# Patient Record
Sex: Female | Born: 1975 | Race: Asian | Hispanic: No | Marital: Married | Smoking: Never smoker
Health system: Southern US, Community
[De-identification: ages and names within clinical notes are randomized; demographics above are authoritative.]

## PROBLEM LIST (undated history)

## (undated) HISTORY — PX: KNEE SURGERY: SHX244

## (undated) HISTORY — PX: EYE SURGERY: SHX253

---

## 2010-09-03 ENCOUNTER — Ambulatory Visit (HOSPITAL_COMMUNITY)
Admission: RE | Admit: 2010-09-03 | Discharge: 2010-09-03 | Disposition: A | Discharge status: Home / Self Care | Payer: 59 | Source: Ambulatory Visit | Attending: Obstetrics and Gynecology | Admitting: Obstetrics and Gynecology

## 2010-09-03 ENCOUNTER — Encounter (HOSPITAL_COMMUNITY): Payer: Self-pay

## 2010-09-03 ENCOUNTER — Ambulatory Visit (HOSPITAL_COMMUNITY)
Admission: RE | Admit: 2010-09-03 | Discharge: 2010-09-03 | Disposition: A | Discharge status: Home / Self Care | Payer: 59 | Source: Ambulatory Visit

## 2010-09-03 ENCOUNTER — Other Ambulatory Visit (HOSPITAL_COMMUNITY): Payer: Self-pay | Admitting: Obstetrics and Gynecology

## 2010-09-03 DIAGNOSIS — Z0489 Encounter for examination and observation for other specified reasons: Secondary | ICD-10-CM

## 2010-09-03 DIAGNOSIS — O2690 Pregnancy related conditions, unspecified, unspecified trimester: Secondary | ICD-10-CM

## 2010-09-03 DIAGNOSIS — O3510X0 Maternal care for (suspected) chromosomal abnormality in fetus, unspecified, not applicable or unspecified: Secondary | ICD-10-CM | POA: Insufficient documentation

## 2010-09-03 DIAGNOSIS — IMO0002 Reserved for concepts with insufficient information to code with codable children: Secondary | ICD-10-CM

## 2010-09-03 DIAGNOSIS — O351XX0 Maternal care for (suspected) chromosomal abnormality in fetus, not applicable or unspecified: Secondary | ICD-10-CM | POA: Insufficient documentation

## 2010-09-03 DIAGNOSIS — Z3682 Encounter for antenatal screening for nuchal translucency: Secondary | ICD-10-CM

## 2010-09-03 DIAGNOSIS — Z3689 Encounter for other specified antenatal screening: Secondary | ICD-10-CM | POA: Insufficient documentation

## 2010-09-03 NOTE — Progress Notes (Signed)
Genetic Counseling  High-Risk Gestation Note  Appointment Date:  09/03/2010 Referred By: Hyman Hopes., MD Date of Birth:  1975-05-25 Partner:  Lars Mage    Pregnancy History: [id number] Estimated Date of Delivery: 03/06/11 Estimated Gestational Age: [redacted]w[redacted]d  I met with Janet Dalton for prenatal genetic counseling given an increased nuchal translucency measurement on first trimester ultrasound. The patient was accompanied by her husband's aunt at the time of today's visit.   We reviewed the results of Janet Dalton's ultrasound today, which revealed a nuchal translucency measurement of 3.0 mm, which is considered to be slightly greater than the normal range (95%tile). We discussed that this increased NT measurement slightly increases the risk for an underlying chromosome condition, birth defect such as a congenital heart defect, or genetic syndrome which may not be able to be detected prenatally. We also discussed that the increased NT can be a normal variation.   We reviewed chromosomes, nondisjunction, and examples of chromosome conditions. We specifically discussed Down syndrome (trisomy 21) and trisomy 52, including the prognoses and common features of each.  We reviewed available screening and diagnostic options for chromosome conditions.  Regarding screening tests, we discussed the options of First screen and ultrasound.  They understand that screening tests are used to modify a patient's a priori risk for aneuploidy, typically based on age.  First screen provides a risk assessment Down syndrome and trisomy 18 by combining the ultrasound NT measurement and biochemistry assessment of maternal blood. This estimate provides a pregnancy specific risk assessment and is not diagnostic for these conditions.  We also reviewed the availability of diagnostic options including CVS and amniocentesis. A risk of 1 in 100 was given for CVS and 1 in 200-300 was given for amniocentesis, the primary  complication being spontaneous pregnancy loss.  It was discussed that not all birth defects can be identified with these procedures.  We discussed the risks, limitations, and benefits of each.    After reviewing these options, Janet Dalton elected to have First trimester screening at the time of today's visit and will return on 10/11/10 for a detailed anatomy scan.  She wishes to pursue these options to help ascertain her pregnancy specific risks for aneuploidy and will consider amniocentesis pending the results of this testing.  She understands that ultrasound and First screen cannot rule out all birth defects or genetic syndromes.  Given the association of increased NT measurement with congenital heart defects, the option of a fetal echocardiogram in the second trimester was also discussed.  Both family histories were reviewed and found to be contributory for developmental delays. Janet Dalton sister reported had a child with her maternal first cousin once removed. This child is not 29 years old and cannot walk or talk. The patient had limited information regarding whether or not an underlying diagnosis had been determined. We discussed that without an underlying cause, we are unable to accurately assess recurrence risk or offer testing to relatives. We reviewed that consanguineous unions increase the risk for underlying autosomal recessive and multifactorial conditions in offspring. A genetics evaluation for this relative, if not previously performed would be helpful to assess for an underlying cause in order to more accurately obtain recurrence risk estimate information.  Without further information regarding the provided family history, an accurate genetic risk cannot be calculated.   Further genetic counseling is warranted if more information is obtained.  Please see scanned pedigree for complete family history information.    She denied exposure to environmental toxins  or chemical agents.  She denied  the use of alcohol, tobacco or street drugs.  She denied significant viral illnesses during the course of her pregnancy.  Her medical and surgical history were noncontributory.   A complete obstetrical ultrasound was performed at the time of today's evaluation.  The ultrasound report is reported separately.    We counseled the patient for approximately 30 minutes regarding the above risks.     Janet Braun Adair Lemar, MS, [facility] 09/03/2010

## 2010-09-10 ENCOUNTER — Telehealth (HOSPITAL_COMMUNITY): Payer: Self-pay | Admitting: MS"

## 2010-09-10 NOTE — Telephone Encounter (Signed)
John Strider called to ask if First Screen results were back. As previously discussed, patient preferred for results to be discussed with Jonny Ruiz over the phone given that English is her second language. First Screen results are screen negative for Down syndrome (1:3500) and Trisomy 18 (1:10,000). Reviewed that the screen is not diagnostic and patient still has option of amniocentesis, if desired. Targeted ultrasound scheduled for 10/11/10. Fetal echocardiogram also still available to the patient. John stated that he would discuss with Janet Dalton. She may still want to pursue amniocentesis. Encouraged John or patient to contact us with additional questions.

## 2010-10-11 ENCOUNTER — Ambulatory Visit (HOSPITAL_COMMUNITY)
Admission: RE | Admit: 2010-10-11 | Discharge: 2010-10-11 | Disposition: A | Discharge status: Home / Self Care | Payer: 59 | Source: Ambulatory Visit | Attending: Obstetrics and Gynecology | Admitting: Obstetrics and Gynecology

## 2010-10-11 DIAGNOSIS — IMO0002 Reserved for concepts with insufficient information to code with codable children: Secondary | ICD-10-CM

## 2010-10-11 DIAGNOSIS — Z1389 Encounter for screening for other disorder: Secondary | ICD-10-CM | POA: Insufficient documentation

## 2010-10-11 DIAGNOSIS — Z363 Encounter for antenatal screening for malformations: Secondary | ICD-10-CM | POA: Insufficient documentation

## 2010-10-11 DIAGNOSIS — O358XX0 Maternal care for other (suspected) fetal abnormality and damage, not applicable or unspecified: Secondary | ICD-10-CM | POA: Insufficient documentation

## 2010-10-11 DIAGNOSIS — Z0489 Encounter for examination and observation for other specified reasons: Secondary | ICD-10-CM

## 2010-10-18 ENCOUNTER — Other Ambulatory Visit: Payer: Self-pay | Admitting: Maternal and Fetal Medicine

## 2010-10-26 ENCOUNTER — Telehealth (HOSPITAL_COMMUNITY): Payer: Self-pay | Admitting: MS"

## 2010-10-26 NOTE — Telephone Encounter (Signed)
Results of [facility] (cell free fetal DNA testing) to Jonny Ruiz, patient's husband, per patient's request. Less than 1 in 10,000 risk for trisomies 21, 18, or 13. Reviewed that this screen is not diagnostic. John stated that he is happy with these results.

## 2012-11-08 ENCOUNTER — Other Ambulatory Visit: Payer: Self-pay

## 2012-12-05 ENCOUNTER — Other Ambulatory Visit (HOSPITAL_COMMUNITY): Payer: Self-pay | Admitting: Obstetrics and Gynecology

## 2012-12-05 DIAGNOSIS — Z3682 Encounter for antenatal screening for nuchal translucency: Secondary | ICD-10-CM

## 2012-12-13 ENCOUNTER — Ambulatory Visit (HOSPITAL_COMMUNITY)
Admission: RE | Admit: 2012-12-13 | Discharge: 2012-12-13 | Disposition: A | Discharge status: Home / Self Care | Payer: 59 | Source: Ambulatory Visit | Attending: Obstetrics and Gynecology | Admitting: Obstetrics and Gynecology

## 2012-12-13 ENCOUNTER — Encounter (HOSPITAL_COMMUNITY): Payer: Self-pay

## 2012-12-13 ENCOUNTER — Other Ambulatory Visit: Payer: Self-pay

## 2012-12-13 DIAGNOSIS — O351XX0 Maternal care for (suspected) chromosomal abnormality in fetus, not applicable or unspecified: Secondary | ICD-10-CM | POA: Insufficient documentation

## 2012-12-13 DIAGNOSIS — O3510X0 Maternal care for (suspected) chromosomal abnormality in fetus, unspecified, not applicable or unspecified: Secondary | ICD-10-CM | POA: Insufficient documentation

## 2012-12-13 DIAGNOSIS — Z3682 Encounter for antenatal screening for nuchal translucency: Secondary | ICD-10-CM

## 2012-12-13 DIAGNOSIS — O0990 Supervision of high risk pregnancy, unspecified, unspecified trimester: Secondary | ICD-10-CM | POA: Insufficient documentation

## 2012-12-13 DIAGNOSIS — Z3689 Encounter for other specified antenatal screening: Secondary | ICD-10-CM | POA: Insufficient documentation

## 2012-12-13 DIAGNOSIS — O09529 Supervision of elderly multigravida, unspecified trimester: Secondary | ICD-10-CM | POA: Insufficient documentation

## 2012-12-14 NOTE — Progress Notes (Signed)
Genetic Counseling  High-Risk Gestation Note  Appointment Date:  12/13/2012 Referred By: Marylen Ponto, DO Date of Birth:  04-16-1975   Pregnancy History: Janet Dalton Estimated Date of Delivery: 06/23/13 Estimated Gestational Age: [redacted]w[redacted]d Attending: Particia Nearing, MD   Janet Dalton was seen for genetic counseling because of a maternal age of 37 y.o..     She was counseled regarding maternal age and the association with risk for chromosome conditions due to nondisjunction with aging of the ova.   We reviewed chromosomes, nondisjunction, and the associated 1 in 114 risk for fetal aneuploidy related to a maternal age of 37 y.o. at [redacted]w[redacted]d gestation.  She was counseled that the risk for aneuploidy decreases as gestational age increases, accounting for those pregnancies which spontaneously abort.  We specifically discussed Down syndrome (trisomy 64), trisomies 65 and 1, and sex chromosome aneuploidies (47,XXX and 47,XXY) including the common features and prognoses of each.   We reviewed available screening options including First Screen, Quad screen, noninvasive prenatal screening (NIPS)/cell free fetal DNA (cffDNA) testing, and detailed ultrasound.  She was counseled that screening tests are used to modify a patient's a priori risk for aneuploidy, typically based on age. This estimate provides a pregnancy specific risk assessment. We reviewed the benefits and limitations of each option. Specifically, we discussed the conditions for which each test screens, the detection rates, and false positive rates of each. She was also counseled regarding diagnostic testing via CVS and amniocentesis. We reviewed the approximate 1 in 300-500 risk for complications for amniocentesis, including spontaneous pregnancy loss. After consideration of all the options, she elected to proceed with NIPS ([facility]) at the time of today's visit.  Those results will be available in approximately 8-10 days.  She stated that she would  possibly consider amniocentesis pending results of NIPS. She declined CVS and amniocentesis at the time of today's visit.   She also expressed interest in pursuing a nuchal translucency ultrasound, which was performed today.  The report will be documented separately.  The patient would like to return for a detailed ultrasound at ~18+ weeks gestation.  This appointment was scheduled for 01/24/13. She understands that screening tests cannot rule out all birth defects or genetic syndromes. The patient was advised of this limitation and states she still does not want additional testing at this time.   Janet Dalton was provided with written information regarding cystic fibrosis (CF) including the carrier frequency and incidence in the Caucasian and Asian populations, the availability of carrier testing and prenatal diagnosis if indicated.  In addition, we discussed that CF is routinely screened for as part of the [place] newborn screening panel.  She declined testing today.   Both family histories were reviewed and updated from the patient's visit from 09/03/2010. The updated family histories were noncontributory for updates regarding birth defects, intellectual disability, and known genetic conditions. See previous genetic counseling note from 09/03/10 for detailed family history discussion. Without further information regarding the provided family history, an accurate genetic risk cannot be calculated. Further genetic counseling is warranted if more information is obtained.  Additionally, the father of the pregnancy is reportedly 71 years old. Advanced paternal age (APA) is defined as paternal age greater than or equal to age 53.  Recent large-scale sequencing studies have shown that approximately 80% of de novo point mutations are of paternal origin.  Many studies have demonstrated a strong correlation between increased paternal age and de novo point mutations.  Although no specific data is available regarding  fetal risks for fathers 71+ years old at conception, it is apparent that the overall risk for single gene conditions is increased.  To estimate the relative increase in risk of a genetic disorder with APA, the heritability of the disease must be considered.  Assuming an approximate 2x increase in risk for conditions that are exclusively paternal in origin, the risk for each individual condition is still relatively low.  It is estimated that the overall chance for a de novo mutation is ~0.5%.  We also discussed the wide range of conditions which can be caused by new dominant gene mutations (achondroplasia, neurofibromatosis, Marfan syndrome etc.).  She was counseled that genetic testing for each individual single gene condition is not warranted or available unless ultrasound or family history concerns lend suspicion to a specific condition.  In addition, we discussed the recommendation for a detailed ultrasound at 18+ weeks gestation and a follow up ultrasound at ~28 weeks to monitor fetal growth.  Janet Dalton denied exposure to environmental toxins or chemical agents. She reported having a knee x ray on 10/13/12. The all-or-none period was discussed, meaning exposures that occur in the first 4 weeks of gestation are typically thought to either not affect the pregnancy at all or result in a miscarriage. The dose of ionizing radiation is an important factor in determining the potential toxicity to a pregnancy. Available study data suggest x ray exposure at 5 rad or less is not associated with an increased risk for birth defects. We discussed that the reported timing and type of x ray would not be expected to increase the risk for adverse pregnancy outcomes. She denied the use of alcohol, tobacco or street drugs. She denied significant viral illnesses during the course of her pregnancy. Her medical and surgical histories were noncontributory.   I counseled Janet Dalton regarding the above risks and  available options.  The approximate face-to-face time with the genetic counselor was 40 minutes.  Janet Plowman, MS,  Patent attorney

## 2012-12-27 ENCOUNTER — Telehealth (HOSPITAL_COMMUNITY): Payer: Self-pay | Admitting: MS"

## 2012-12-27 NOTE — Telephone Encounter (Signed)
Called Janet Dalton to discuss her cell free fetal DNA test results.  Mrs. Janet Dalton had Panorama testing through [facility].  Testing was offered because of advanced maternal age.   The patient was identified by name and DOB.  We reviewed that these are within normal limits, showing a less than 1 in 10,000 risk for trisomies 21, 18 and 13, and 1 in 1,000 risk for monosomy X (Turner syndrome).  In addition, the risk for triploidy/vanishing twin and sex chromosome trisomies (47,XXX and 47,XXY) was also low risk.  We reviewed that this testing identifies > 99% of pregnancies with trisomy 24, trisomy 65, sex chromosome trisomies (47,XXX and 47,XXY), and triploidy. The detection rate for trisomy 18 is 96%.  The detection rate for monosomy X is ~92%.  The false positive rate is <0.1% for all conditions. Testing was also consistent with female gender.  The patient did wish to know gender.  She understands that this testing does not identify all genetic conditions.  All questions were answered to her satisfaction, she was encouraged to call with additional questions or concerns.  Quinn Plowman, MS Certified Genetic Counselor 12/27/2012 10:00 AM

## 2013-01-23 ENCOUNTER — Other Ambulatory Visit (HOSPITAL_COMMUNITY): Payer: Self-pay | Admitting: Obstetrics and Gynecology

## 2013-01-23 DIAGNOSIS — O09529 Supervision of elderly multigravida, unspecified trimester: Secondary | ICD-10-CM

## 2013-01-24 ENCOUNTER — Other Ambulatory Visit (HOSPITAL_COMMUNITY): Payer: 59

## 2013-01-24 ENCOUNTER — Ambulatory Visit (HOSPITAL_COMMUNITY)
Admission: RE | Admit: 2013-01-24 | Discharge: 2013-01-24 | Disposition: A | Discharge status: Home / Self Care | Payer: 59 | Source: Ambulatory Visit | Attending: Obstetrics and Gynecology | Admitting: Obstetrics and Gynecology

## 2013-01-24 DIAGNOSIS — O358XX0 Maternal care for other (suspected) fetal abnormality and damage, not applicable or unspecified: Secondary | ICD-10-CM | POA: Insufficient documentation

## 2013-01-24 DIAGNOSIS — Z363 Encounter for antenatal screening for malformations: Secondary | ICD-10-CM | POA: Insufficient documentation

## 2013-01-24 DIAGNOSIS — O09529 Supervision of elderly multigravida, unspecified trimester: Secondary | ICD-10-CM | POA: Insufficient documentation

## 2013-01-24 DIAGNOSIS — Z1389 Encounter for screening for other disorder: Secondary | ICD-10-CM | POA: Insufficient documentation

## 2013-01-30 ENCOUNTER — Other Ambulatory Visit (HOSPITAL_COMMUNITY): Payer: 59

## 2013-10-18 ENCOUNTER — Encounter (HOSPITAL_COMMUNITY): Payer: Self-pay | Admitting: *Deleted

## 2013-12-16 ENCOUNTER — Encounter (HOSPITAL_COMMUNITY): Payer: Self-pay | Admitting: *Deleted

## 2015-09-20 IMAGING — US US OB NUCHAL TRANSLUCENCY 1ST GEST
1 series · 13 of 28 positions shown · non-contrast
Comparison: none

[Series 1: us ob nuchal translucency 1st gest · 0.18mm/px · 13 of 34 slices shown]
[im 2/34]
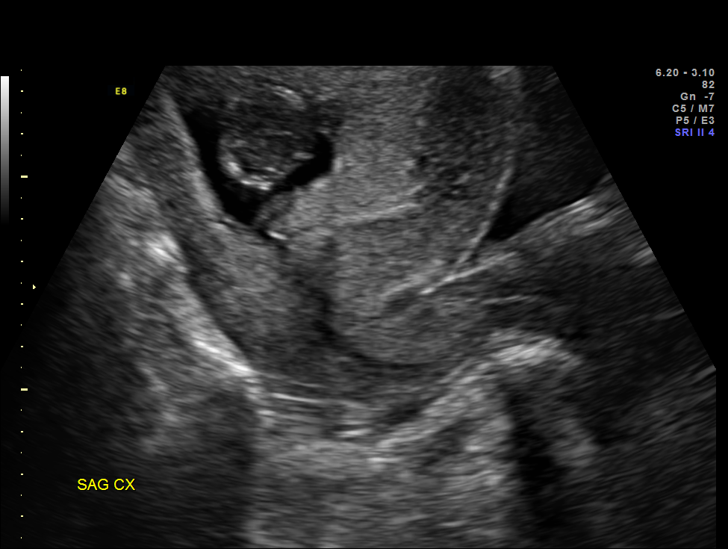
[im 4/34]
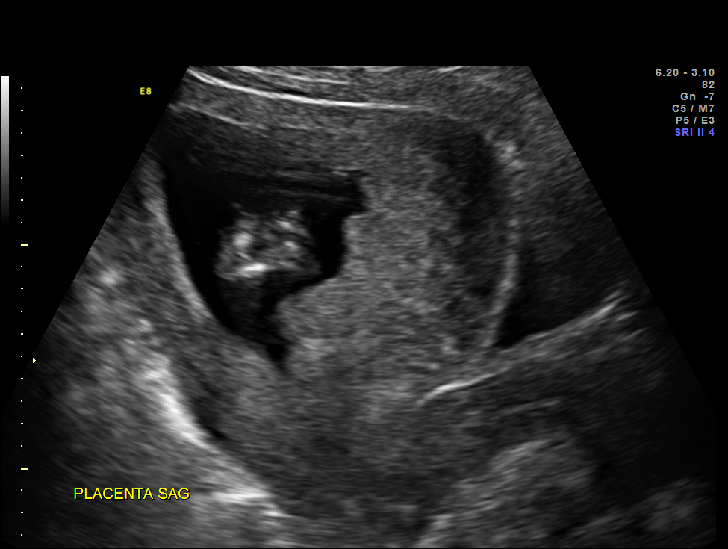
[im 7/34]
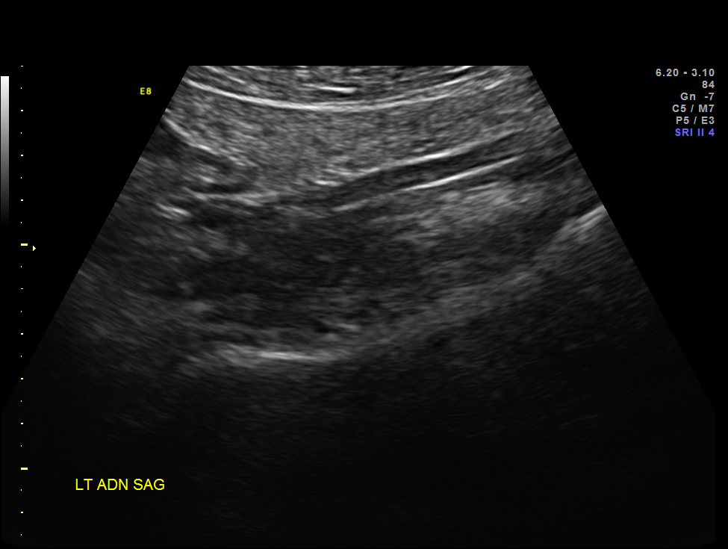
[im 9/34]
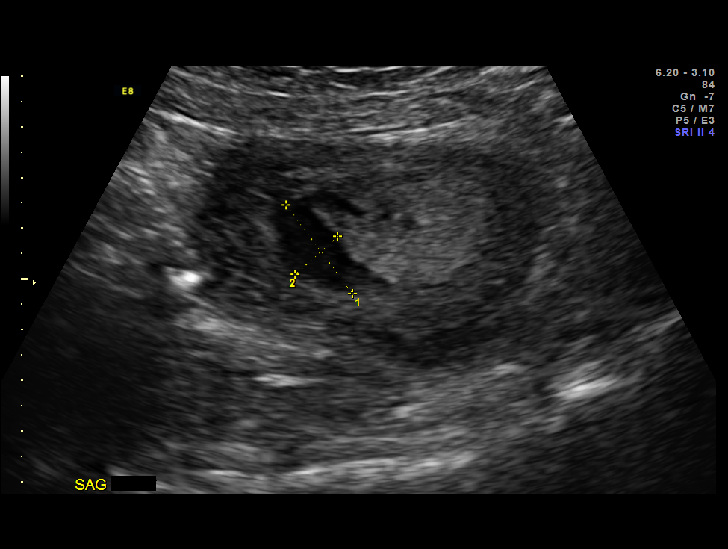
[im 12/34]
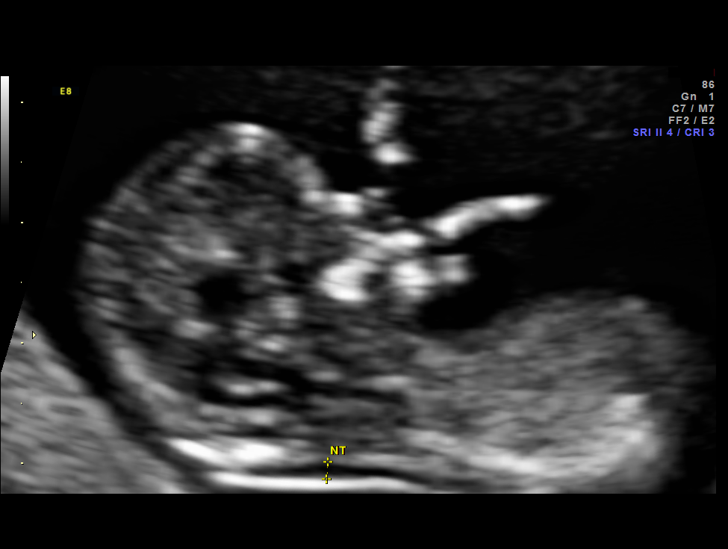
[im 14/34]
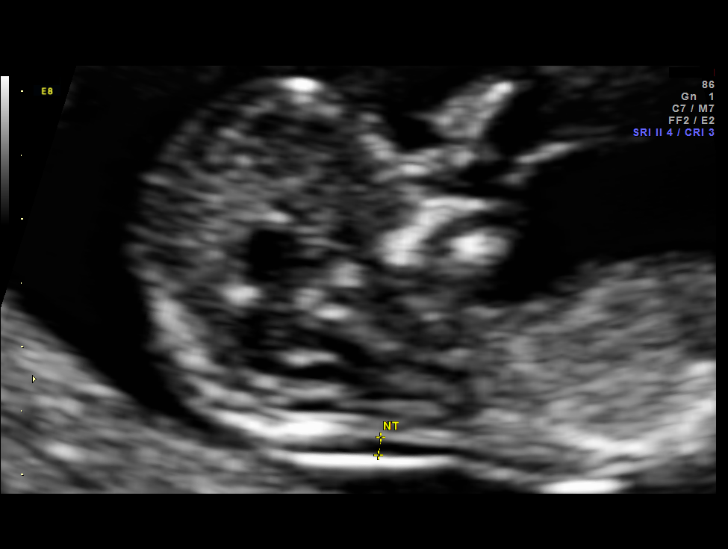
[im 18/34]
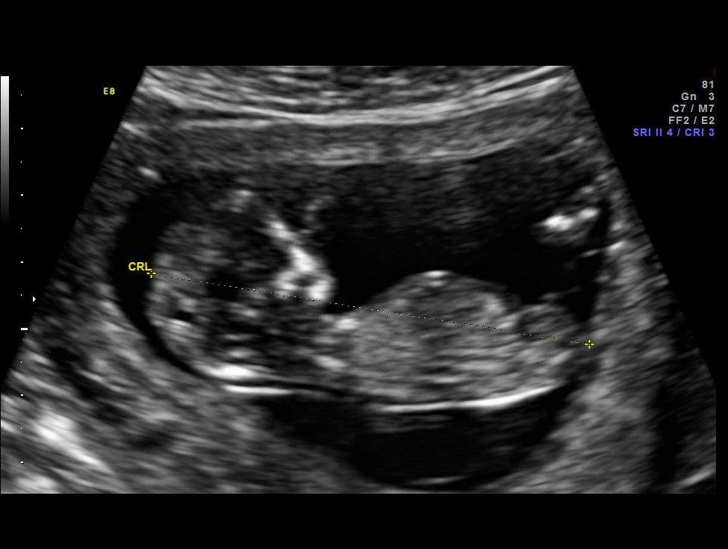
[im 20/34]
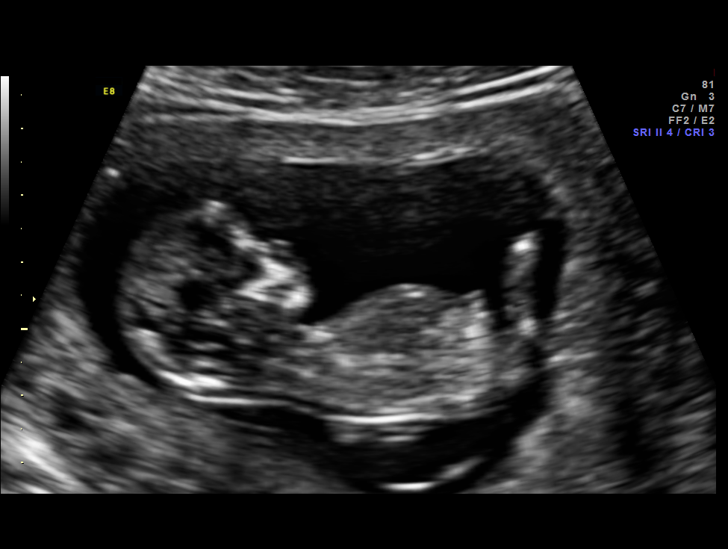
[im 23/34]
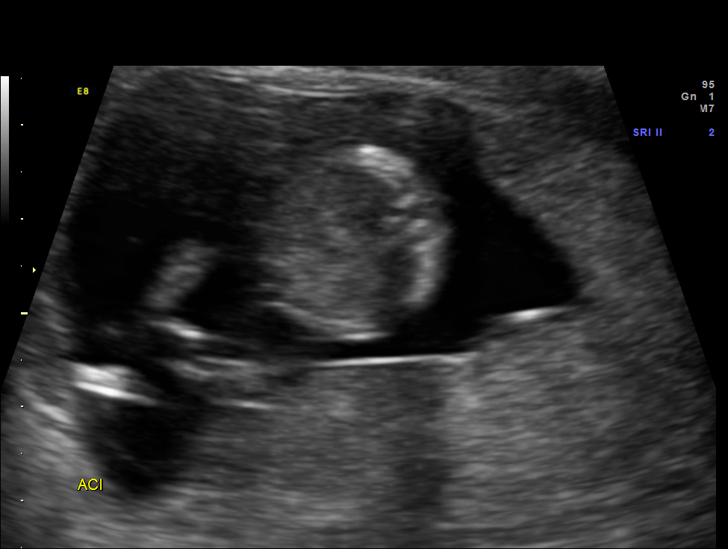
[im 25/34]
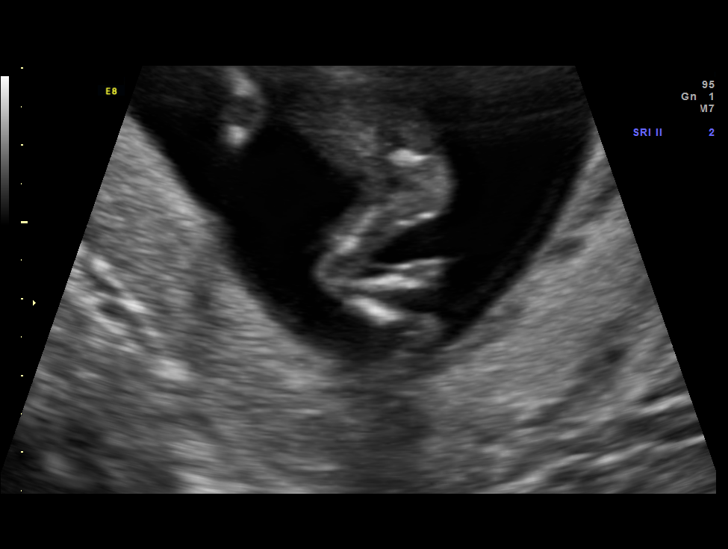
[im 27/34]
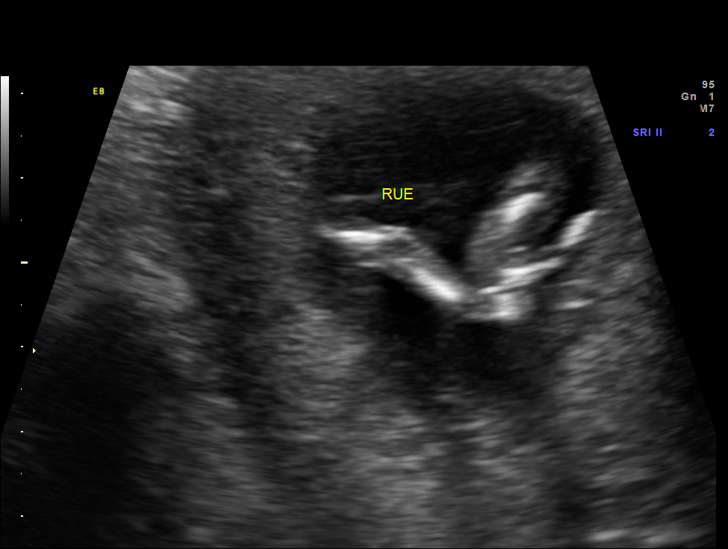
[im 30/34]
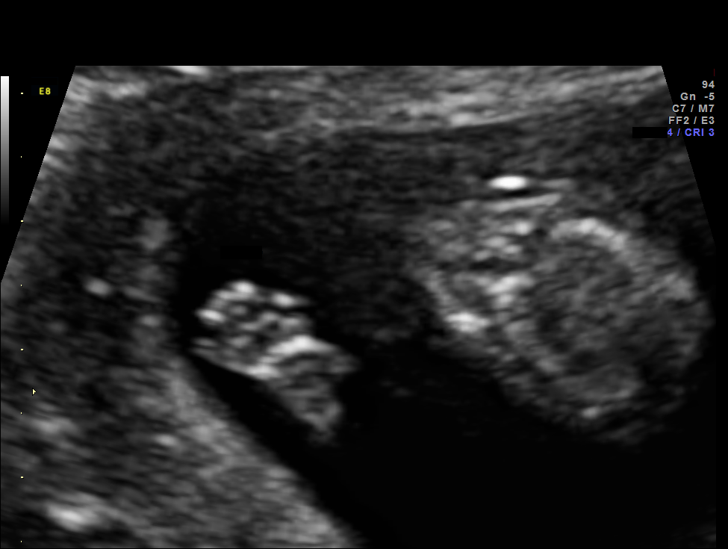
[im 32/34]
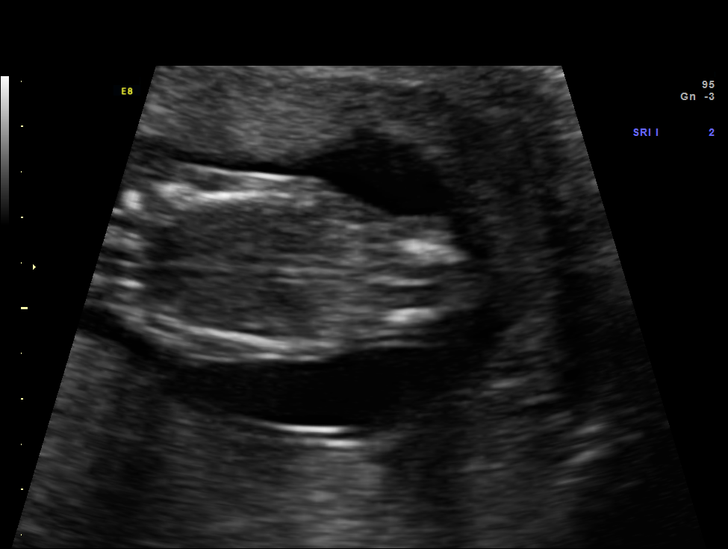

[13 of 28 positions shown; findings below may reference images not displayed]

OBSTETRICS REPORT
                      (Signed Final 12/13/2012 [DATE])

Service(s) Provided

 US FETAL NUCHAL TRANSLUCENCY                          76813.0
 MEASUREMENT
Indications

 First trimester aneuploidy screen (NT)
 Advanced maternal age (35yo), Multigravida
 Advanced paternal age (HARTUNG)
Fetal Evaluation

 Num Of Fetuses:    1
 Fetal Heart Rate:  163                          bpm
 Cardiac Activity:  Observed
 Presentation:      Transverse, head to
                    maternal right
 Placenta:          Anterior

 Amniotic Fluid
 AFI FV:      Subjectively within normal limits
Gestational Age

 Best:          12w 4d     Det. By:  Early Ultrasound         EDD:   06/23/13
                                     (11/12/12)
1st Trimester Genetic Sonogram Screening

 CRL:            66.3  mm    G. Age:   12w 5d                 EDD:   06/22/13
 Nuc Trans:       1.6  mm

 Nasal Bone:                 Present
Cervix Uterus Adnexa

 Cervix:       Normal appearance by transabdominal scan.

 Adnexa:     No abnormality visualized.
Impression

 IUP at 12+4 weeks
 No gross abnormalities identified
 NT measurement was within normal limits for this GA; NB
 present
 Normal amniotic fluid volume
 Measurements consistent with prior US

 After genetic counseling, Ms. Delvecchio decided to have cell free
 DNA (Panorama) screening. We will forward the results to
 you as soon as they are available.
Recommendations

 Follow-up ultrasound in 6 weeks for detailed anatomic survey

 questions or concerns.

## 2016-04-22 DIAGNOSIS — N925 Other specified irregular menstruation: Secondary | ICD-10-CM | POA: Diagnosis not present

## 2016-04-22 DIAGNOSIS — Z6821 Body mass index (BMI) 21.0-21.9, adult: Secondary | ICD-10-CM | POA: Diagnosis not present

## 2016-04-22 DIAGNOSIS — Z01419 Encounter for gynecological examination (general) (routine) without abnormal findings: Secondary | ICD-10-CM | POA: Diagnosis not present

## 2016-04-22 DIAGNOSIS — N926 Irregular menstruation, unspecified: Secondary | ICD-10-CM | POA: Diagnosis not present

## 2016-04-22 DIAGNOSIS — Z01411 Encounter for gynecological examination (general) (routine) with abnormal findings: Secondary | ICD-10-CM | POA: Diagnosis not present

## 2016-05-02 DIAGNOSIS — N925 Other specified irregular menstruation: Secondary | ICD-10-CM | POA: Diagnosis not present

## 2016-05-27 DIAGNOSIS — R87615 Unsatisfactory cytologic smear of cervix: Secondary | ICD-10-CM | POA: Diagnosis not present

## 2016-05-27 DIAGNOSIS — N926 Irregular menstruation, unspecified: Secondary | ICD-10-CM | POA: Diagnosis not present

## 2016-05-27 DIAGNOSIS — Z01419 Encounter for gynecological examination (general) (routine) without abnormal findings: Secondary | ICD-10-CM | POA: Diagnosis not present

## 2016-08-23 DIAGNOSIS — B355 Tinea imbricata: Secondary | ICD-10-CM | POA: Diagnosis not present

## 2016-10-04 DIAGNOSIS — R0789 Other chest pain: Secondary | ICD-10-CM | POA: Diagnosis not present

## 2017-04-28 DIAGNOSIS — Z0000 Encounter for general adult medical examination without abnormal findings: Secondary | ICD-10-CM | POA: Diagnosis not present

## 2017-04-28 DIAGNOSIS — Z6821 Body mass index (BMI) 21.0-21.9, adult: Secondary | ICD-10-CM | POA: Diagnosis not present

## 2017-04-28 DIAGNOSIS — Z01419 Encounter for gynecological examination (general) (routine) without abnormal findings: Secondary | ICD-10-CM | POA: Diagnosis not present

## 2019-01-18 ENCOUNTER — Other Ambulatory Visit: Payer: Self-pay

## 2019-01-18 DIAGNOSIS — Z20822 Contact with and (suspected) exposure to covid-19: Secondary | ICD-10-CM

## 2019-01-19 LAB — NOVEL CORONAVIRUS, NAA: SARS-CoV-2, NAA: NOT DETECTED

## 2020-11-29 ENCOUNTER — Encounter (HOSPITAL_COMMUNITY): Payer: Self-pay

## 2020-11-29 ENCOUNTER — Inpatient Hospital Stay (HOSPITAL_COMMUNITY): Payer: 59

## 2020-11-29 ENCOUNTER — Emergency Department (HOSPITAL_COMMUNITY): Payer: 59

## 2020-11-29 ENCOUNTER — Other Ambulatory Visit: Payer: Self-pay

## 2020-11-29 ENCOUNTER — Inpatient Hospital Stay (HOSPITAL_COMMUNITY)
Admission: EM | Admit: 2020-11-29 | Discharge: 2020-12-01 | DRG: 489 | Disposition: A | Discharge status: Home / Self Care | Payer: 59 | Attending: Orthopaedic Surgery | Admitting: Orthopaedic Surgery

## 2020-11-29 DIAGNOSIS — W010XXA Fall on same level from slipping, tripping and stumbling without subsequent striking against object, initial encounter: Secondary | ICD-10-CM | POA: Diagnosis present

## 2020-11-29 DIAGNOSIS — Y92096 Garden or yard of other non-institutional residence as the place of occurrence of the external cause: Secondary | ICD-10-CM

## 2020-11-29 DIAGNOSIS — M25862 Other specified joint disorders, left knee: Secondary | ICD-10-CM | POA: Diagnosis present

## 2020-11-29 DIAGNOSIS — S82202A Unspecified fracture of shaft of left tibia, initial encounter for closed fracture: Secondary | ICD-10-CM | POA: Diagnosis present

## 2020-11-29 DIAGNOSIS — Z20822 Contact with and (suspected) exposure to covid-19: Secondary | ICD-10-CM | POA: Diagnosis present

## 2020-11-29 DIAGNOSIS — S82839A Other fracture of upper and lower end of unspecified fibula, initial encounter for closed fracture: Secondary | ICD-10-CM

## 2020-11-29 DIAGNOSIS — S82392A Other fracture of lower end of left tibia, initial encounter for closed fracture: Secondary | ICD-10-CM | POA: Diagnosis present

## 2020-11-29 DIAGNOSIS — S8255XA Nondisplaced fracture of medial malleolus of left tibia, initial encounter for closed fracture: Secondary | ICD-10-CM | POA: Diagnosis not present

## 2020-11-29 DIAGNOSIS — S82302A Unspecified fracture of lower end of left tibia, initial encounter for closed fracture: Secondary | ICD-10-CM | POA: Diagnosis not present

## 2020-11-29 DIAGNOSIS — M619 Calcification and ossification of muscle, unspecified: Secondary | ICD-10-CM | POA: Diagnosis not present

## 2020-11-29 DIAGNOSIS — Z419 Encounter for procedure for purposes other than remedying health state, unspecified: Secondary | ICD-10-CM

## 2020-11-29 DIAGNOSIS — S82842A Displaced bimalleolar fracture of left lower leg, initial encounter for closed fracture: Principal | ICD-10-CM | POA: Diagnosis present

## 2020-11-29 DIAGNOSIS — S82252A Displaced comminuted fracture of shaft of left tibia, initial encounter for closed fracture: Secondary | ICD-10-CM | POA: Diagnosis not present

## 2020-11-29 LAB — RESP PANEL BY RT-PCR (FLU A&B, COVID) ARPGX2
Influenza A by PCR: NEGATIVE
Influenza B by PCR: NEGATIVE
SARS Coronavirus 2 by RT PCR: NEGATIVE

## 2020-11-29 LAB — I-STAT BETA HCG BLOOD, ED (MC, WL, AP ONLY): I-stat hCG, quantitative: <5 m[IU]/mL (ref <5)

## 2020-11-29 MED ORDER — ENOXAPARIN SODIUM 40 MG/0.4ML IJ SOSY
40.0000 mg | PREFILLED_SYRINGE | INTRAMUSCULAR | Status: DC
  Administered 2020-11-29 – 2020-12-01 (×2): 40 mg via SUBCUTANEOUS
  Filled 2020-11-29 (×2): qty 0.4

## 2020-11-29 MED ORDER — ETOMIDATE 2 MG/ML IV SOLN
3.0000 mg | Freq: Once | INTRAVENOUS | Status: AC
  Administered 2020-11-29: 3 mg via INTRAVENOUS

## 2020-11-29 MED ORDER — HYDROMORPHONE HCL 1 MG/ML IJ SOLN
0.5000 mg | Freq: Once | INTRAMUSCULAR | Status: AC
Start: 2020-11-29 — End: 2020-11-29
  Administered 2020-11-29: 0.5 mg via INTRAVENOUS
  Filled 2020-11-29: qty 1

## 2020-11-29 MED ORDER — DOCUSATE SODIUM 100 MG PO CAPS
100.0000 mg | ORAL_CAPSULE | Freq: Two times a day (BID) | ORAL | Status: DC
  Administered 2020-11-30 – 2020-12-01 (×3): 100 mg via ORAL
  Filled 2020-11-29 (×3): qty 1

## 2020-11-29 MED ORDER — ETOMIDATE 2 MG/ML IV SOLN: 3.0000 mg | Freq: Once | INTRAVENOUS | Status: DC

## 2020-11-29 MED ORDER — HYDROMORPHONE HCL 1 MG/ML IJ SOLN
0.5000 mg | Freq: Once | INTRAMUSCULAR | Status: AC
  Administered 2020-11-29: 0.5 mg via INTRAVENOUS
  Filled 2020-11-29: qty 1

## 2020-11-29 MED ORDER — OXYCODONE HCL 5 MG PO TABS
10.0000 mg | ORAL_TABLET | ORAL | Status: DC | PRN
  Administered 2020-11-30 – 2020-12-01 (×4): 10 mg via ORAL
  Filled 2020-11-29 (×4): qty 2

## 2020-11-29 MED ORDER — ACETAMINOPHEN 500 MG PO TABS
1000.0000 mg | ORAL_TABLET | Freq: Four times a day (QID) | ORAL | Status: AC
  Administered 2020-11-30 (×2): 1000 mg via ORAL
  Filled 2020-11-29 (×3): qty 2

## 2020-11-29 MED ORDER — HYDROMORPHONE HCL 1 MG/ML IJ SOLN
0.5000 mg | INTRAMUSCULAR | Status: DC | PRN
  Administered 2020-11-29 – 2020-12-01 (×2): 1 mg via INTRAVENOUS
  Filled 2020-11-29 (×3): qty 1

## 2020-11-29 MED ORDER — HYDROMORPHONE HCL 1 MG/ML IJ SOLN
0.5000 mg | Freq: Once | INTRAMUSCULAR | Status: AC
  Administered 2020-11-29: 0.5 mg via INTRAVENOUS

## 2020-11-29 MED ORDER — ONDANSETRON HCL 4 MG/2ML IJ SOLN
4.0000 mg | Freq: Once | INTRAMUSCULAR | Status: AC
  Administered 2020-11-29: 4 mg via INTRAVENOUS
  Filled 2020-11-29: qty 2

## 2020-11-29 MED ORDER — ONDANSETRON HCL 4 MG PO TABS
4.0000 mg | ORAL_TABLET | Freq: Four times a day (QID) | ORAL | Status: DC | PRN
  Administered 2020-11-30: 4 mg via ORAL
  Filled 2020-11-29: qty 1

## 2020-11-29 MED ORDER — ONDANSETRON HCL 4 MG/2ML IJ SOLN: 4.0000 mg | Freq: Four times a day (QID) | INTRAMUSCULAR | Status: DC | PRN

## 2020-11-29 MED ORDER — ETOMIDATE 2 MG/ML IV SOLN
3.0000 mg | Freq: Once | INTRAVENOUS | Status: AC
  Administered 2020-11-29: 3 mg via INTRAVENOUS
  Filled 2020-11-29: qty 10

## 2020-11-29 MED ORDER — SODIUM CHLORIDE 0.9 % IV SOLN: INTRAVENOUS | Status: DC

## 2020-11-29 MED ORDER — OXYCODONE HCL 5 MG PO TABS
5.0000 mg | ORAL_TABLET | ORAL | Status: DC | PRN
  Administered 2020-11-30: 5 mg via ORAL
  Filled 2020-11-29: qty 1

## 2020-11-29 NOTE — Progress Notes (Signed)
RT present for conscious sedation to repair foot/ankle.  RT had ambu bag prepared and ETCO2 monitor in place.  Pt remained stable throughout procedure with VS INR.  RT was excused by MD.  RT will monitor as needed.

## 2020-11-29 NOTE — H&P (Signed)
ORTHOPAEDIC CONSULTATION  REQUESTING PHYSICIAN: Benjiman Core, MD  Chief Complaint: Left tib-fib pain/injury  HPI: Janet Dalton is a 45 y.o. female who presents with distal tib-fib injury after slipping over acorns earlier today.  She was cleaning up her lawn.  She denies any previous bony injuries or injuries about the extremity.  She denies pain in any other sites.  She is quite healthy and has no other medical problems presently she is not taking any anticoagulation.  She does have 2 young children 7 and 9.  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  History reviewed. No pertinent past medical history. Past Surgical History:  Procedure Laterality Date   EYE SURGERY     Social History   Socioeconomic History   Marital status: Married    Spouse name: Not on file   Number of children: Not on file   Years of education: Not on file   Highest education level: Not on file  Occupational History   Not on file  Tobacco Use   Smoking status: Not on file   Smokeless tobacco: Not on file  Substance and Sexual Activity   Alcohol use: Not on file   Drug use: Not on file   Sexual activity: Not on file  Other Topics Concern   Not on file  Social History Narrative   Not on file   Social Determinants of Health   Financial Resource Strain: Not on file  Food Insecurity: Not on file  Transportation Needs: Not on file  Physical Activity: Not on file  Stress: Not on file  Social Connections: Not on file   No family history on file. - negative except otherwise stated in the family history section No Known Allergies Prior to Admission medications   Medication Sig Start Date End Date Taking? Authorizing Provider  Prenatal Vitamins (DIS) TABS Take by mouth.      [provider]   DG Tibia/Fibula Left  Result Date: 11/29/2020 CLINICAL DATA:  Fall EXAM: LEFT TIBIA AND FIBULA - 2 VIEW COMPARISON:  None. FINDINGS: Comminuted fractures noted in the distal left tibial and fibular  metadiaphysis. No subluxation or dislocation. On the lateral view, the tibial fracture appears to extend into the posterior malleolus and ankle joint. IMPRESSION: Highly comminuted distal left tibia and fibular fractures with extension into the tibiotalar joint. Electronically Signed   By: Charlett Nose M.D.   On: 11/29/2020 19:27   DG Ankle Complete Left  Result Date: 11/29/2020 CLINICAL DATA:  Fall EXAM: LEFT ANKLE COMPLETE - 3+ VIEW COMPARISON:  None. FINDINGS: Comminuted fractures noted in the distal left tibial and fibular metadiaphysis. No subluxation or dislocation. On the lateral view, the tibial fracture appears to extend into the posterior malleolus and ankle joint. IMPRESSION: Highly comminuted distal left tibia and fibular fractures with extension into the tibiotalar joint. Electronically Signed   By: Charlett Nose M.D.   On: 11/29/2020 19:28     Positive ROS: All other systems have been reviewed and were otherwise negative with the exception of those mentioned in the HPI and as above.  Physical Exam: General: No acute distress Cardiovascular: No pedal edema Respiratory: No cyanosis, no use of accessory musculature GI: No organomegaly, abdomen is soft and non-tender Skin: No lesions in the area of chief complaint Neurologic: Sensation intact distally Psychiatric: Patient is at baseline mood and affect Lymphatic: No axillary or cervical lymphadenopathy  MUSCULOSKELETAL:  Left lower extremity with obvious deformity.  Sensation is intact in all distributions of the  left foot.  Compartments are soft and compressible.  There is swelling about the leg.  There are some external rotation deformity.  She is able to extend her digits of her left foot as well as plantarflex the left foot  Independent Imaging Review: Left distal tib-fib fracture with extension into the posterior malleolus  Assessment: 45 year old female with a distal tib-fib fracture.  At this time I have discussed that  given the deformity and rotation, I would ultimately recommend surgical fixation.  Specifically I would like to obtain a CT scan for further assessment and surgical planning.  She is quite anxious and concerned about the hardware.  I described that ultimately the vast majority of cases this hardware is asymptomatic.  I did discuss that there are risks associated with it including possible infection or need for future revision or removal.  I described that these risks are quite low.  At this time there is no concern for compartment syndrome  Plan: Left tibia and fibula open reduction internal fixation, possible left tibial nailing  I will plan to admit her tonight and operate on 10/17  Thank you for the consult and the opportunity to see [name]  Huel Cote, MD [facility] 8:27 PM

## 2020-11-29 NOTE — Progress Notes (Signed)
Orthopedic Tech Progress Note Patient Details:  Janet Dalton 11-11-1975 [id number]  Ortho Devices Type of Ortho Device: Short leg splint, Stirrup splint Ortho Device/Splint Location: LLE Ortho Device/Splint Interventions: Ordered, Application   Post Interventions Patient Tolerated: Fair Assisted Janet Dalton and MD with short leg and stirrup application.  Janet Dalton 11/29/2020, 8:47 PM

## 2020-11-29 NOTE — ED Provider Notes (Signed)
[facility] EMERGENCY DEPARTMENT Provider Note   CSN: [id number] Arrival date & time: 11/29/20  1757     History Chief Complaint  Patient presents with   anke injury    Janet Dalton is a 45 y.o. female.  HPI Patient presents after a fall.  Reportedly slipped on acorns and deformity left ankle.  States she had to bend it back into place.  No other injury but does states she hurts a little bit more by the knee.  Did not hit her head.  States a little anxious at this time but otherwise fine.  Has not had dinner tonight.  Does not think she is pregnant.    History reviewed. No pertinent past medical history.  Patient Active Problem List   Diagnosis Date Noted   Left tibial fracture 11/29/2020    Past Surgical History:  Procedure Laterality Date   EYE SURGERY       OB History     Gravida  3   Para  1   Term  1   Preterm  0   AB  1   Living  1      SAB  0   IAB  1   Ectopic  0   Multiple  0   Live Births  1           No family history on file.     Home Medications Prior to Admission medications   Medication Sig Start Date End Date Taking? Authorizing Provider  Prenatal Vitamins (DIS) TABS Take by mouth.      [provider]    Allergies    Patient has no known allergies.  Review of Systems   Review of Systems  Constitutional:  Negative for appetite change.  HENT:  Negative for congestion.   Cardiovascular:  Negative for chest pain.  Gastrointestinal:  Negative for abdominal pain.  Musculoskeletal:  Negative for back pain.       Left lower leg and ankle pain.  Deformity of left ankle with foot rotated externally.  Skin:  Positive for wound.  Neurological:  Negative for weakness.  Psychiatric/Behavioral:  Negative for confusion.    Physical Exam Updated Vital Signs BP (!) 108/91   Pulse 72   Temp 98.2 F (36.8 C)   Resp 13   Ht 5' (1.524 m)   Wt 53.1 kg   SpO2 100%   BMI 22.85 kg/m   Physical  Exam Vitals and nursing note reviewed.  HENT:     Head: Atraumatic.     Mouth/Throat:     Mouth: Mucous membranes are moist.  Eyes:     Pupils: Pupils are equal, round, and reactive to light.  Cardiovascular:     Rate and Rhythm: Regular rhythm.  Pulmonary:     Breath sounds: Normal breath sounds.  Abdominal:     Tenderness: There is no abdominal tenderness.  Musculoskeletal:        General: Tenderness present.     Cervical back: Neck supple.     Comments: Tenderness to proximal left lower leg.  No real knee tenderness.  Tenderness and deformity of left ankle.  Foot rotated externally.  Anteriorly on the leg there is some ecchymosis with a small puncture wound.  Dorsalis pedis pulse intact.  Neurological:     Mental Status: She is alert and oriented to person, place, and time.       ED Results / Procedures / Treatments   Labs (all labs  ordered are listed, but only abnormal results are displayed) Labs Reviewed  RESP PANEL BY RT-PCR (FLU A&B, COVID) ARPGX2  I-STAT BETA HCG BLOOD, ED ([facility], [facility], [facility] ONLY)    EKG None  Radiology DG Tibia/Fibula Left  Result Date: 11/29/2020 CLINICAL DATA:  Fall EXAM: LEFT TIBIA AND FIBULA - 2 VIEW COMPARISON:  None. FINDINGS: Comminuted fractures noted in the distal left tibial and fibular metadiaphysis. No subluxation or dislocation. On the lateral view, the tibial fracture appears to extend into the posterior malleolus and ankle joint. IMPRESSION: Highly comminuted distal left tibia and fibular fractures with extension into the tibiotalar joint. Electronically Signed   By: Charlett Nose M.D.   On: 11/29/2020 19:27   DG Ankle Complete Left  Result Date: 11/29/2020 CLINICAL DATA:  Fall EXAM: LEFT ANKLE COMPLETE - 3+ VIEW COMPARISON:  None. FINDINGS: Comminuted fractures noted in the distal left tibial and fibular metadiaphysis. No subluxation or dislocation. On the lateral view, the tibial fracture appears to extend into the posterior malleolus  and ankle joint. IMPRESSION: Highly comminuted distal left tibia and fibular fractures with extension into the tibiotalar joint. Electronically Signed   By: Charlett Nose M.D.   On: 11/29/2020 19:28    Procedures .Ortho Injury Treatment  Date/Time: 11/29/2020 9:01 PM Performed by: Benjiman Core, MD Authorized by: Benjiman Core, MD   Consent:    Consent obtained:  Written   Consent given by:  Patient   Risks discussed:  Fracture, nerve damage, restricted joint movement, vascular damage, irreducible dislocation and recurrent dislocation   Alternatives discussed:  No treatment, alternative treatment, immobilization and delayed treatment Universal protocol:    Procedure explained and questions answered to patient or proxy's satisfaction: yes     Patient identity confirmed:  Verbally with patient and arm bandInjury location: lower leg Location details: left lower leg Injury type: fracture Fracture type: tibial plafond Pre-procedure neurovascular assessment: neurovascularly intact Pre-procedure distal perfusion: normal Pre-procedure neurological function: normal Pre-procedure range of motion: reduced  Anesthesia: Local anesthesia used: no  Patient sedated: Yes. Refer to sedation procedure documentation for details of sedation. Manipulation performed: yes Immobilization: splint Splint type: short leg Splint Applied by: Ortho Tech Supplies used: Ortho-Glass Post-procedure neurovascular assessment: post-procedure neurovascularly intact Post-procedure distal perfusion: normal Post-procedure neurological function: normal Post-procedure range of motion: improved   .Sedation  Date/Time: 11/29/2020 9:02 PM Performed by: Benjiman Core, MD Authorized by: Benjiman Core, MD   Consent:    Consent obtained:  Written   Consent given by:  Patient   Risks discussed:  Allergic reaction, nausea, inadequate sedation, dysrhythmia, prolonged hypoxia resulting in organ damage,  prolonged sedation necessitating reversal, vomiting and respiratory compromise necessitating ventilatory assistance and intubation   Alternatives discussed:  Analgesia without sedation Universal protocol:    Immediately prior to procedure, a time out was called: yes     Patient identity confirmed:  Arm band and verbally with patient Indications:    Procedure performed:  Fracture reduction   Procedure necessitating sedation performed by:  Physician performing sedation Pre-sedation assessment:    Time since last food or drink:  5 hrs   ASA classification: class 1 - normal, healthy patient     Mouth opening:  2 finger widths   Thyromental distance:  3 finger widths   Mallampati score:  II - soft palate, uvula, fauces visible   Neck mobility: normal     Pre-sedation assessments completed and reviewed: airway patency, cardiovascular function, hydration status, mental status, nausea/vomiting, pain level and respiratory  function   Immediate pre-procedure details:    Reassessment: Patient reassessed immediately prior to procedure     Reviewed: vital signs     Verified: bag valve mask available, emergency equipment available, intubation equipment available, IV patency confirmed and oxygen available   Procedure details (see MAR for exact dosages):    Preoxygenation:  Nasal cannula   Sedation:  Etomidate   Intended level of sedation: deep   Analgesia:  Hydromorphone   Intra-procedure monitoring:  Blood pressure monitoring and cardiac monitor   Intra-procedure events: none     Total Provider sedation time (minutes):  10 Post-procedure details:    Post-sedation assessment completed:  11/29/2020 9:03 PM   Recovery: Patient returned to pre-procedure baseline     Post-sedation assessments completed and reviewed: airway patency, cardiovascular function, hydration status, mental status, nausea/vomiting, pain level and respiratory function     Patient is stable for discharge or admission: yes      Procedure completion:  Tolerated well, no immediate complications   Medications Ordered in ED Medications  etomidate (AMIDATE) injection 3 mg (3 mg Intravenous Not Given 11/29/20 2046)  HYDROmorphone (DILAUDID) injection 0.5 mg (0.5 mg Intravenous Given 11/29/20 1818)  ondansetron (ZOFRAN) injection 4 mg (4 mg Intravenous Given 11/29/20 1817)  HYDROmorphone (DILAUDID) injection 0.5 mg (0.5 mg Intravenous Given 11/29/20 2004)  etomidate (AMIDATE) injection 3 mg (3 mg Intravenous Given 11/29/20 2032)  etomidate (AMIDATE) injection 3 mg (3 mg Intravenous Given 11/29/20 2035)  HYDROmorphone (DILAUDID) injection 0.5 mg (0.5 mg Intravenous Given 11/29/20 2036)  etomidate (AMIDATE) injection 3 mg (3 mg Intravenous Given 11/29/20 2033)    ED Course  I have reviewed the triage vital signs and the nursing notes.  Pertinent labs & imaging results that were available during my care of the patient were reviewed by me and considered in my medical decision making (see chart for details).    MDM Rules/Calculators/A&P                           Patient was fall.  Ankle fracture.  Distal tibia and distal fibula.  Discussed with Dr. Steward Drone from orthopedic surgery.  Recommend CT scan.  Discussed with patient and orthopedic.  Likely OR tomorrow.  Skin is closed.  No other apparent injury.  Mild back pain but no real tenderness.  No compartment syndrome at this time.  Sedated and splinting.  Admit to Ortho. Final Clinical Impression(s) / ED Diagnoses Final diagnoses:  Closed fracture of distal end of left tibia, unspecified fracture morphology, initial encounter  Closed fracture of distal end of fibula, unspecified fracture morphology, initial encounter    Rx / DC Orders ED Discharge Orders     None        Benjiman Core, MD 11/29/20 2103

## 2020-11-29 NOTE — ED Notes (Signed)
Pt gone to CT 

## 2020-11-29 NOTE — ED Triage Notes (Addendum)
Pt here via EMS d/t left ankle injury. Slipped on acorns and fell. Obvious deformity to left ankle. Pulses intact with EMS, able to move toes. sensation decreased . No LOC. No other injury reported  138/70 P 94 RR 23 100% RA  Alert and oriented X4

## 2020-11-30 ENCOUNTER — Inpatient Hospital Stay (HOSPITAL_COMMUNITY): Payer: 59

## 2020-11-30 ENCOUNTER — Encounter (HOSPITAL_COMMUNITY): Payer: Self-pay | Admitting: Orthopaedic Surgery

## 2020-11-30 ENCOUNTER — Inpatient Hospital Stay (HOSPITAL_COMMUNITY): Payer: 59 | Admitting: Anesthesiology

## 2020-11-30 ENCOUNTER — Encounter (HOSPITAL_COMMUNITY)
Admission: EM | Disposition: A | Discharge status: Home / Self Care | Payer: Self-pay | Source: Home / Self Care | Attending: Orthopaedic Surgery

## 2020-11-30 DIAGNOSIS — S8255XA Nondisplaced fracture of medial malleolus of left tibia, initial encounter for closed fracture: Secondary | ICD-10-CM

## 2020-11-30 DIAGNOSIS — S82252A Displaced comminuted fracture of shaft of left tibia, initial encounter for closed fracture: Secondary | ICD-10-CM

## 2020-11-30 DIAGNOSIS — S82302A Unspecified fracture of lower end of left tibia, initial encounter for closed fracture: Secondary | ICD-10-CM

## 2020-11-30 DIAGNOSIS — M619 Calcification and ossification of muscle, unspecified: Secondary | ICD-10-CM

## 2020-11-30 HISTORY — PX: TIBIA IM NAIL INSERTION: SHX2516

## 2020-11-30 LAB — BASIC METABOLIC PANEL
Anion gap: 9 (ref 5–15)
BUN: 10 mg/dL (ref 6–20)
CO2: 20 mmol/L — ABNORMAL LOW (ref 22–32)
Calcium: 8.5 mg/dL — ABNORMAL LOW (ref 8.9–10.3)
Chloride: 107 mmol/L (ref 98–111)
Creatinine, Ser: 0.64 mg/dL (ref 0.44–1.00)
GFR, Estimated: >60 mL/min (ref >60)
Glucose, Bld: 101 mg/dL — ABNORMAL HIGH (ref 70–99)
Potassium: 3.4 mmol/L — ABNORMAL LOW (ref 3.5–5.1)
Sodium: 136 mmol/L (ref 135–145)

## 2020-11-30 LAB — CBC
HCT: 36.9 % (ref 36.0–46.0)
Hemoglobin: 12 g/dL (ref 12.0–15.0)
MCH: 31.3 pg (ref 26.0–34.0)
MCHC: 32.5 g/dL (ref 30.0–36.0)
MCV: 96.1 fL (ref 80.0–100.0)
Platelets: 211 10*3/uL (ref 150–400)
RBC: 3.84 MIL/uL — ABNORMAL LOW (ref 3.87–5.11)
RDW: 11.9 % (ref 11.5–15.5)
WBC: 14.5 10*3/uL — ABNORMAL HIGH (ref 4.0–10.5)
nRBC: 0 % (ref 0.0–0.2)

## 2020-11-30 LAB — HIV ANTIBODY (ROUTINE TESTING W REFLEX): HIV Screen 4th Generation wRfx: NONREACTIVE

## 2020-11-30 LAB — SURGICAL PCR SCREEN
MRSA, PCR: NEGATIVE
Staphylococcus aureus: NEGATIVE

## 2020-11-30 SURGERY — INSERTION, INTRAMEDULLARY ROD, TIBIA
Anesthesia: General | Site: Leg Lower | Laterality: Left

## 2020-11-30 MED ORDER — CHLORHEXIDINE GLUCONATE 0.12 % MT SOLN: 15.0000 mL | Freq: Once | OROMUCOSAL | Status: AC

## 2020-11-30 MED ORDER — TRANEXAMIC ACID-NACL 1000-0.7 MG/100ML-% IV SOLN
INTRAVENOUS | Status: DC | PRN
  Administered 2020-11-30: 1000 mg via INTRAVENOUS

## 2020-11-30 MED ORDER — DEXAMETHASONE SODIUM PHOSPHATE 10 MG/ML IJ SOLN
INTRAMUSCULAR | Status: DC | PRN
  Administered 2020-11-30: 5 mg via INTRAVENOUS

## 2020-11-30 MED ORDER — MIDAZOLAM HCL 2 MG/2ML IJ SOLN
INTRAMUSCULAR | Status: AC
  Administered 2020-11-30: 2 mg via INTRAVENOUS
  Filled 2020-11-30: qty 2

## 2020-11-30 MED ORDER — PROMETHAZINE HCL 25 MG/ML IJ SOLN: 6.2500 mg | INTRAMUSCULAR | Status: DC | PRN

## 2020-11-30 MED ORDER — BUPIVACAINE-EPINEPHRINE (PF) 0.5% -1:200000 IJ SOLN
INTRAMUSCULAR | Status: DC | PRN
  Administered 2020-11-30: 20 mL via PERINEURAL
  Administered 2020-11-30: 10 mL via PERINEURAL

## 2020-11-30 MED ORDER — ACETAMINOPHEN 500 MG PO TABS
1000.0000 mg | ORAL_TABLET | Freq: Four times a day (QID) | ORAL | Status: DC
  Administered 2020-12-01 (×3): 1000 mg via ORAL
  Filled 2020-11-30 (×3): qty 2

## 2020-11-30 MED ORDER — PHENYLEPHRINE HCL-NACL 20-0.9 MG/250ML-% IV SOLN
INTRAVENOUS | Status: DC | PRN
  Administered 2020-11-30: 15 ug/min via INTRAVENOUS

## 2020-11-30 MED ORDER — ONDANSETRON HCL 4 MG/2ML IJ SOLN
INTRAMUSCULAR | Status: DC | PRN
  Administered 2020-11-30: 4 mg via INTRAVENOUS

## 2020-11-30 MED ORDER — CEFAZOLIN SODIUM-DEXTROSE 2-3 GM-%(50ML) IV SOLR
INTRAVENOUS | Status: DC | PRN
  Administered 2020-11-30: 2 g via INTRAVENOUS

## 2020-11-30 MED ORDER — CEFAZOLIN SODIUM-DEXTROSE 2-4 GM/100ML-% IV SOLN
INTRAVENOUS | Status: AC
  Filled 2020-11-30: qty 100

## 2020-11-30 MED ORDER — PROPOFOL 10 MG/ML IV BOLUS
INTRAVENOUS | Status: DC | PRN
Start: 2020-11-30 — End: 2020-11-30
  Administered 2020-11-30: 100 mg via INTRAVENOUS

## 2020-11-30 MED ORDER — ASPIRIN 325 MG PO TABS
325.0000 mg | ORAL_TABLET | Freq: Every day | ORAL | Status: DC
  Administered 2020-12-01: 325 mg via ORAL
  Filled 2020-11-30: qty 1

## 2020-11-30 MED ORDER — CHLORHEXIDINE GLUCONATE 0.12 % MT SOLN
OROMUCOSAL | Status: AC
  Administered 2020-11-30: 15 mL via OROMUCOSAL
  Filled 2020-11-30: qty 15

## 2020-11-30 MED ORDER — CEFAZOLIN SODIUM-DEXTROSE 2-4 GM/100ML-% IV SOLN
2.0000 g | Freq: Three times a day (TID) | INTRAVENOUS | Status: AC
Start: 2020-11-30 — End: 2020-12-01
  Administered 2020-12-01 (×2): 2 g via INTRAVENOUS
  Filled 2020-11-30 (×3): qty 100

## 2020-11-30 MED ORDER — LACTATED RINGERS IV SOLN: INTRAVENOUS | Status: DC

## 2020-11-30 MED ORDER — SCOPOLAMINE 1 MG/3DAYS TD PT72
MEDICATED_PATCH | TRANSDERMAL | Status: DC | PRN
  Administered 2020-11-30: 1 via TRANSDERMAL

## 2020-11-30 MED ORDER — PHENYLEPHRINE 40 MCG/ML (10ML) SYRINGE FOR IV PUSH (FOR BLOOD PRESSURE SUPPORT)
PREFILLED_SYRINGE | INTRAVENOUS | Status: AC
  Filled 2020-11-30: qty 10

## 2020-11-30 MED ORDER — DIPHENHYDRAMINE HCL 50 MG/ML IJ SOLN
INTRAMUSCULAR | Status: AC
  Filled 2020-11-30: qty 1

## 2020-11-30 MED ORDER — EPHEDRINE 5 MG/ML INJ
INTRAVENOUS | Status: AC
  Filled 2020-11-30: qty 5

## 2020-11-30 MED ORDER — LIDOCAINE 2% (20 MG/ML) 5 ML SYRINGE
INTRAMUSCULAR | Status: DC | PRN
  Administered 2020-11-30: 40 mg via INTRAVENOUS

## 2020-11-30 MED ORDER — DIPHENHYDRAMINE HCL 50 MG/ML IJ SOLN
INTRAMUSCULAR | Status: DC | PRN
  Administered 2020-11-30: 6.25 mg via INTRAVENOUS

## 2020-11-30 MED ORDER — TRANEXAMIC ACID-NACL 1000-0.7 MG/100ML-% IV SOLN
INTRAVENOUS | Status: AC
  Filled 2020-11-30: qty 100

## 2020-11-30 MED ORDER — FENTANYL CITRATE (PF) 100 MCG/2ML IJ SOLN
INTRAMUSCULAR | Status: AC
  Administered 2020-11-30: 100 ug via INTRAVENOUS
  Filled 2020-11-30: qty 2

## 2020-11-30 MED ORDER — 0.9 % SODIUM CHLORIDE (POUR BTL) OPTIME
TOPICAL | Status: DC | PRN
  Administered 2020-11-30: 1000 mL

## 2020-11-30 MED ORDER — EPHEDRINE SULFATE-NACL 50-0.9 MG/10ML-% IV SOSY
PREFILLED_SYRINGE | INTRAVENOUS | Status: DC | PRN
  Administered 2020-11-30: 10 mg via INTRAVENOUS
  Administered 2020-11-30: 5 mg via INTRAVENOUS

## 2020-11-30 MED ORDER — OXYCODONE HCL 5 MG/5ML PO SOLN
5.0000 mg | Freq: Once | ORAL | Status: DC | PRN
Start: 2020-11-30 — End: 2020-11-30

## 2020-11-30 MED ORDER — PHENYLEPHRINE 40 MCG/ML (10ML) SYRINGE FOR IV PUSH (FOR BLOOD PRESSURE SUPPORT)
PREFILLED_SYRINGE | INTRAVENOUS | Status: DC | PRN
  Administered 2020-11-30: 160 ug via INTRAVENOUS
  Administered 2020-11-30 (×2): 120 ug via INTRAVENOUS

## 2020-11-30 MED ORDER — ORAL CARE MOUTH RINSE: 15.0000 mL | Freq: Once | OROMUCOSAL | Status: AC

## 2020-11-30 MED ORDER — FENTANYL CITRATE (PF) 100 MCG/2ML IJ SOLN: 25.0000 ug | INTRAMUSCULAR | Status: DC | PRN

## 2020-11-30 MED ORDER — MIDAZOLAM HCL 2 MG/2ML IJ SOLN
2.0000 mg | Freq: Once | INTRAMUSCULAR | Status: AC
  Filled 2020-11-30: qty 2

## 2020-11-30 MED ORDER — SCOPOLAMINE 1 MG/3DAYS TD PT72
MEDICATED_PATCH | TRANSDERMAL | Status: AC
  Filled 2020-11-30: qty 1

## 2020-11-30 MED ORDER — CLONIDINE HCL (ANALGESIA) 100 MCG/ML EP SOLN
EPIDURAL | Status: DC | PRN
  Administered 2020-11-30: 33 ug
  Administered 2020-11-30: 67 ug

## 2020-11-30 MED ORDER — OXYCODONE HCL 5 MG PO TABS: 5.0000 mg | ORAL_TABLET | Freq: Once | ORAL | Status: DC | PRN

## 2020-11-30 MED ORDER — FENTANYL CITRATE (PF) 100 MCG/2ML IJ SOLN
100.0000 ug | Freq: Once | INTRAMUSCULAR | Status: AC
  Filled 2020-11-30: qty 2

## 2020-11-30 SURGICAL SUPPLY — 68 items
BAG COUNTER SPONGE SURGICOUNT (BAG) ×2 IMPLANT
BANDAGE ESMARK 6X9 LF (GAUZE/BANDAGES/DRESSINGS) IMPLANT
BIT DRILL 2.4 AO COUPLING CANN (BIT) ×2 IMPLANT
BIT DRILL CALIBRATED 4.3X320MM (BIT) ×1 IMPLANT
BIT DRILL TWSTPONT CRW 3.2X180 (DRILL) ×1 IMPLANT
BNDG COHESIVE 4X5 TAN STRL (GAUZE/BANDAGES/DRESSINGS) ×2 IMPLANT
BNDG ELASTIC 4X5.8 VLCR STR LF (GAUZE/BANDAGES/DRESSINGS) ×4 IMPLANT
BNDG ELASTIC 6X5.8 VLCR STR LF (GAUZE/BANDAGES/DRESSINGS) ×2 IMPLANT
BNDG ESMARK 6X9 LF (GAUZE/BANDAGES/DRESSINGS)
BNDG GAUZE ELAST 4 BULKY (GAUZE/BANDAGES/DRESSINGS) ×2 IMPLANT
COVER SURGICAL LIGHT HANDLE (MISCELLANEOUS) ×4 IMPLANT
CUFF CONTOUR SPSB 18 (TOURNIQUET CUFF) ×2 IMPLANT
CUFF TOURN SGL QUICK 34 (TOURNIQUET CUFF)
CUFF TOURN SGL QUICK 42 (TOURNIQUET CUFF) IMPLANT
CUFF TRNQT CYL 34X4.125X (TOURNIQUET CUFF) IMPLANT
DRAPE C-ARM 42X72 X-RAY (DRAPES) ×2 IMPLANT
DRAPE IMP U-DRAPE 54X76 (DRAPES) ×2 IMPLANT
DRAPE INCISE IOBAN 66X45 STRL (DRAPES) ×2 IMPLANT
DRAPE ORTHO SPLIT 77X108 STRL (DRAPES) ×2
DRAPE SURG ORHT 6 SPLT 77X108 (DRAPES) ×2 IMPLANT
DRAPE U-SHAPE 47X51 STRL (DRAPES) ×2 IMPLANT
DRILL CALIBRATED 4.3X320MM (BIT) ×2
DRILL TWISTPOINT CROWE 3.2X180 (DRILL) ×2
DRSG ADAPTIC 3X8 NADH LF (GAUZE/BANDAGES/DRESSINGS) ×2 IMPLANT
DRSG XEROFORM 1X8 (GAUZE/BANDAGES/DRESSINGS) ×2 IMPLANT
ELECT REM PT RETURN 9FT ADLT (ELECTROSURGICAL) ×2
ELECTRODE REM PT RTRN 9FT ADLT (ELECTROSURGICAL) ×1 IMPLANT
EVACUATOR 1/8 PVC DRAIN (DRAIN) IMPLANT
GAUZE SPONGE 4X4 12PLY STRL (GAUZE/BANDAGES/DRESSINGS) ×2 IMPLANT
GAUZE SPONGE 4X4 16PLY XRAY LF (GAUZE/BANDAGES/DRESSINGS) ×2 IMPLANT
GLOVE SURG NEOPR MICRO LF SZ8 (GLOVE) ×4 IMPLANT
GLOVE SURG ORTHO LTX SZ8 (GLOVE) ×2 IMPLANT
GLOVE SURG UNDER POLY LF SZ7.5 (GLOVE) ×2 IMPLANT
GOWN STRL REUS W/ TWL LRG LVL3 (GOWN DISPOSABLE) ×3 IMPLANT
GOWN STRL REUS W/TWL LRG LVL3 (GOWN DISPOSABLE) ×3
GUIDEPIN VERSANAIL DSP 3.2X444 (ORTHOPEDIC DISPOSABLE SUPPLIES) ×2 IMPLANT
GUIDEWIRE 2.6X80 BEAD TIP (WIRE) ×1 IMPLANT
GUIDWIRE 2.6X80 BEAD TIP (WIRE) ×2
K-WIRE TROC 1.25X150 (WIRE) ×6
KIT BASIN OR (CUSTOM PROCEDURE TRAY) ×2 IMPLANT
KIT TURNOVER KIT B (KITS) ×2 IMPLANT
KWIRE TROC 1.25X150 (WIRE) ×3 IMPLANT
NAIL TIB 7.5X260 (Nail) ×2 IMPLANT
PACK ORTHO EXTREMITY (CUSTOM PROCEDURE TRAY) ×2 IMPLANT
PACK UNIVERSAL I (CUSTOM PROCEDURE TRAY) ×2 IMPLANT
PAD ARMBOARD 7.5X6 YLW CONV (MISCELLANEOUS) ×4 IMPLANT
PADDING CAST SYNTHETIC 4 (CAST SUPPLIES) ×2
PADDING CAST SYNTHETIC 4X4 STR (CAST SUPPLIES) ×2 IMPLANT
SCREW CANN PT 4.0X34 (Screw) ×2 IMPLANT
SCREW CANNULATED NS 4MMX36MM (Screw) ×2 IMPLANT
SCREW CANNULATED PT 4.0X40 (Screw) ×2 IMPLANT
SCREW CORT HEX ST 3.5X95 (Screw) ×2 IMPLANT
SCREW CORT TI DBL LEAD 4X32 (Screw) ×2 IMPLANT
SCREW CORT TI DBL LEAD 4X36 (Screw) ×2 IMPLANT
SCREW CORT TI DBL LEAD 5X26 (Screw) ×2 IMPLANT
SCREW CORT TI DBL LEAD 5X30 (Screw) ×2 IMPLANT
STAPLER VISISTAT 35W (STAPLE) IMPLANT
STOCKINETTE TUBULAR 6 INCH (GAUZE/BANDAGES/DRESSINGS) ×2 IMPLANT
SUT ETHILON 2 0 FS 18 (SUTURE) ×2 IMPLANT
SUT PROLENE 3 0 PS 2 (SUTURE) IMPLANT
SUT VIC AB 0 CT1 27 (SUTURE) ×1
SUT VIC AB 0 CT1 27XBRD ANBCTR (SUTURE) ×1 IMPLANT
SUT VIC AB 2-0 CT1 27 (SUTURE) ×2
SUT VIC AB 2-0 CT1 TAPERPNT 27 (SUTURE) ×2 IMPLANT
TOWEL GREEN STERILE (TOWEL DISPOSABLE) ×2 IMPLANT
TOWEL GREEN STERILE FF (TOWEL DISPOSABLE) ×2 IMPLANT
TUBE CONNECTING 12X1/4 (SUCTIONS) ×2 IMPLANT
YANKAUER SUCT BULB TIP NO VENT (SUCTIONS) ×2 IMPLANT

## 2020-11-30 NOTE — Anesthesia Procedure Notes (Signed)
Procedure Name: LMA Insertion Date/Time: 11/30/2020 5:39 PM Performed by: Shireen Quan, CRNA Pre-anesthesia Checklist: Patient identified, Emergency Drugs available, Suction available and Patient being monitored Patient Re-evaluated:Patient Re-evaluated prior to induction Oxygen Delivery Method: Circle System Utilized Preoxygenation: Pre-oxygenation with 100% oxygen Induction Type: IV induction Ventilation: Mask ventilation without difficulty LMA: LMA inserted LMA Size: 4.0 Number of attempts: 1 Placement Confirmation: positive ETCO2 Tube secured with: Tape Dental Injury: Teeth and Oropharynx as per pre-operative assessment

## 2020-11-30 NOTE — Anesthesia Postprocedure Evaluation (Signed)
Anesthesia Post Note  Patient: Janet Dalton  Procedure(s) Performed: INTRAMEDULLARY (IM) NAIL TIBIAL AND TIBIAL PLATE (Left: Leg Lower)     Patient location during evaluation: PACU Anesthesia Type: General Level of consciousness: sedated Pain management: pain level controlled Vital Signs Assessment: post-procedure vital signs reviewed and stable Respiratory status: spontaneous breathing and respiratory function stable Cardiovascular status: stable Postop Assessment: no apparent nausea or vomiting Anesthetic complications: no   No notable events documented.  Last Vitals:  Vitals:   11/30/20 2050 11/30/20 2105  BP: 109/77 107/77  Pulse: 76 74  Resp: 18 17  Temp:    SpO2: 100% 100%    Last Pain:  Vitals:   11/30/20 2105  TempSrc:   PainSc: 0-No pain                 Veronica Fretz DANIEL

## 2020-11-30 NOTE — Anesthesia Procedure Notes (Signed)
Anesthesia Regional Block: Popliteal block   Pre-Anesthetic Checklist: , timeout performed,  Correct Patient, Correct Site, Correct Laterality,  Correct Procedure, Correct Position, site marked,  Risks and benefits discussed,  Pre-op evaluation,  At surgeon's request and post-op pain management  Laterality: Left  Prep: Maximum Sterile Barrier Precautions used, chloraprep       Needles:  Injection technique: Single-shot  Needle Type: Echogenic Stimulator Needle     Needle Length: 9cm  Needle Gauge: 22     Additional Needles:   Procedures:,,,, ultrasound used (permanent image in chart),,    Narrative:  Start time: 11/30/2020 3:22 PM End time: 11/30/2020 3:25 PM Injection made incrementally with aspirations every 5 mL.  Performed by: Personally  Anesthesiologist: Kaylyn Layer, MD  Additional Notes: Risks, benefits, and alternative discussed. Patient gave consent for procedure. Patient prepped and draped in sterile fashion. Sedation administered, patient remains easily responsive to voice. Relevant anatomy identified with ultrasound guidance. Local anesthetic given in 5cc increments with no signs or symptoms of intravascular injection. No pain or paraesthesias with injection. Patient monitored throughout procedure with signs of LAST or immediate complications. Tolerated well. Ultrasound image placed in chart.  Amalia Greenhouse, MD

## 2020-11-30 NOTE — Brief Op Note (Signed)
   Brief Op Note  Date of Surgery: 11/30/2020  Preoperative Diagnosis: LEFT TIB FIB FRACTURE  Postoperative Diagnosis: same  Procedure: Procedure(s): INTRAMEDULLARY (IM) NAIL TIBIAL AND TIBIAL PLATE  Implants: Implant Name Type Inv. Item Serial No. Manufacturer Lot No. LRB No. Used Action  tibial nail    40026 [employer] [id number] Left 1 Implanted  SCREW CANN PT 4.0X34 - [id number] Screw SCREW CANN PT 4.0X34 [id number] [employer](ORTH,TRAU,BIO,SG) [id number] Left 1 Implanted  SCREW CANNULATED NS 4MMX36MM - [id number] Screw SCREW CANNULATED NS 4MMX36MM [id number] [employer](ORTH,TRAU,BIO,SG) [id number] Left 1 Implanted  SCREW CORT TI DBL LEAD 5X30 - [id number] Screw SCREW CORT TI DBL LEAD 5X30 [id number] [employer](ORTH,TRAU,BIO,SG) [id number] Left 1 Implanted  SCREW CORT TI DBL LEAD 5X26 - [id number] Screw SCREW CORT TI DBL LEAD 5X26 [id number] [employer](ORTH,TRAU,BIO,SG) [id number] Left 1 Implanted  SCREW CORT TI DBL LEAD 4X32 - [id number] Screw SCREW CORT TI DBL LEAD 4X32 [id number] [employer](ORTH,TRAU,BIO,SG) [id number] Left 1 Implanted  SCREW CORT TI DBL LEAD 4X36 - [id number] Screw SCREW CORT TI DBL LEAD 4X36  [employer](ORTH,TRAU,BIO,SG) [id number] Left 1 Implanted    Surgeons: Surgeon(s): Huel Cote, MD  Anesthesia: General    Estimated Blood Loss: See anesthesia record  Complications: None  Condition to PACU: Stable  Benancio Deeds, MD 11/30/2020 7:44 PM

## 2020-11-30 NOTE — Progress Notes (Signed)
Orthopedic Tech Progress Note Patient Details:  Janet Dalton 28-Dec-1975 [id number]  Ortho Devices Type of Ortho Device: CAM walker Ortho Device/Splint Location: LLE Ortho Device/Splint Interventions: Ordered   Post Interventions Patient Tolerated: Fair Dropped off CAM walker at OR desk with nurse. Darleen Crocker 11/30/2020, 7:21 PM

## 2020-11-30 NOTE — ED Notes (Signed)
Pt requesting pain medication for ankle.

## 2020-11-30 NOTE — Op Note (Signed)
Date of Surgery: 11/30/2020  INDICATIONS: Janet Dalton is a 45 y.o.-year-old female with  Left distal tibia fracture with extension into the posterior malleolus as well as a distal fibula fracture.  The risk and benefits of the procedure with discussed in detail and documented in the pre-operative evaluation.  PREOPERATIVE DIAGNOSIS: 1.  Left distal tibia fibular shaft fracture 2.  Bimalleolar equivalent ankle fracture with posterior and lateral malleolus 3.  Left knee lateral extra-articular calcification/body  POSTOPERATIVE DIAGNOSIS: Same.  PROCEDURE: 1.  Intramedullary nailing left tibia 2.  Open treatment of bimalleolar ankle fracture 3.  Removal of foreign body left knee  SURGEON: Benancio Deeds MD  ASSISTANT: Olga Millers, ATC; necessary for the timely completion of procedure and due to complexity of procedure.  ANESTHESIA:  general  IV FLUIDS AND URINE: See anesthesia record.  ANTIBIOTICS: Ancef 2 g  ESTIMATED BLOOD LOSS: 25 mL.  IMPLANTS:  Implant Name Type Inv. Item Serial No. Manufacturer Lot No. LRB No. Used Action  tibial nail    40026 [employer]ET ORTHO AND TRAUMA [id number] Left 1 Implanted  SCREW CANN PT 4.0X34 - [id number] Screw SCREW CANN PT 4.0X34 [id number] [employer](ORTH,TRAU,BIO,SG) [id number] Left 1 Implanted  SCREW CANNULATED NS 4MMX36MM - [id number] Screw SCREW CANNULATED NS 4MMX36MM [id number] [employer](ORTH,TRAU,BIO,SG) [id number] Left 1 Implanted  SCREW CORT TI DBL LEAD 5X30 - [id number] Screw SCREW CORT TI DBL LEAD 5X30 [id number] [employer](ORTH,TRAU,BIO,SG) [id number] Left 1 Implanted  SCREW CORT TI DBL LEAD 5X26 - [id number] Screw SCREW CORT TI DBL LEAD 5X26 [id number] [employer](ORTH,TRAU,BIO,SG) [id number] Left 1 Implanted  SCREW CORT TI DBL LEAD 4X32 - [id number] Screw SCREW CORT TI DBL LEAD 4X32 [id number] [employer](ORTH,TRAU,BIO,SG) [id number] Left 1 Implanted  SCREW CORT TI DBL LEAD 4X36 - [id number] Screw SCREW CORT TI DBL LEAD 4X36  [employer]  [employer](ORTH,TRAU,BIO,SG) [id number] Left 1 Implanted    DRAINS: None  CULTURES: None  COMPLICATIONS: none  DESCRIPTION OF PROCEDURE:  The patient that in the preoperative holding area and the correct laterality was marked according to universal protocol with nursing.  A ultrasound-guided regional block was performed.  This was done by anesthesia.  She was subsequently taken back to the operating room.  The splint was removed.  Compartments were soft.  She was prepped and draped in the usual sterile fashion.  A timeout again confirmed correct laterality.  We first started with a to P posterior malleolar screws.  A percutaneous incision was made localizing using a Freer.  A snap was used to dissect down to the bone.  Guidewires were placed under AP and lateral fluoroscopy with care to enter the posterior malleolus.  These were measured drilled and subsequently placed.  Following this attention was then turned to the knee.  She did have a previous scar from a removal of calcification many years ago.  This was at the site of our lateral parapatellar approach.  15 blade was taken down sharply through the skin.  The calcification had returned and was identified.  This was shelled out using Metzenbaum scissors.  This was sent for pathology.  Dissection was continued down through the first layer of the retinaculum.  We subsequently were able to be extracapsular.  Guidepin was used to localize the starting pin.  This was put into place under AP and lateral fluoroscopy.  We subsequently used the opening reamer.  Ball-tipped guidewire was passed and a series of towel bumps and finger were used to obtain an anatomic reduction.  The wire was passed  to the level of the screws.  The nail was then measured off of the guidewire and subsequently placed.  2 proximal static screws and 2 static distal screws were then placed.  Distally we used perfect circle technique medial to lateral.  At this time stress views of the ankle  were taken and mortise view.  This did not appear to widen with a external rotation force.  As such the decision was made not to additionally instrument the fibula.  The wounds were thoroughly irrigated closed in layers of 0 Vicryl 2-0 Vicryl and 3-0 nylon.  Soft dressing was applied.  A cam boot walker was applied.   Olga Millers ATC was necessary for opening, closing, retracting, limb positioning and overall facilitation and timely completion of the procedure.     POSTOPERATIVE PLAN: The patient will be touchdown weightbearing on the left lower extremity for 2 weeks.  At that point we will advance her weightbearing.  She will use crutches for ambulation.  Benancio Deeds, MD 7:45 PM

## 2020-11-30 NOTE — Transfer of Care (Signed)
Immediate Anesthesia Transfer of Care Note  Patient: Janet Dalton  Procedure(s) Performed: INTRAMEDULLARY (IM) NAIL TIBIAL AND TIBIAL PLATE (Left: Leg Lower)  Patient Location: PACU  Anesthesia Type:General and Regional  Level of Consciousness: awake, alert  and oriented  Airway & Oxygen Therapy: Patient Spontanous Breathing  Post-op Assessment: Report given to RN and Post -op Vital signs reviewed and stable  Post vital signs: Reviewed and stable  Last Vitals:  Vitals Value Taken Time  BP 132/84   Temp    Pulse 72 11/30/20 1953  Resp 16 11/30/20 1953  SpO2 99 % 11/30/20 1953  Vitals shown include unvalidated device data.  Last Pain:  Vitals:   11/30/20 1503  TempSrc:   PainSc: 6          Complications: No notable events documented.

## 2020-11-30 NOTE — ED Notes (Signed)
Attempted to elevate the pt leg and absolutely did not tolerate this and refused me to do anything further to. "Do not touch it"

## 2020-11-30 NOTE — Progress Notes (Signed)
   Subjective:  Patient reports pain as mild. States that she did not sleep very well, is anxious over surgery. Has been NPO.  Objective:   VITALS:   Vitals:   11/30/20 0400 11/30/20 0500 11/30/20 0530 11/30/20 0600  BP: 99/68 108/60 100/61 95/76  Pulse: 63 66 73 84  Resp: 12 15 14 15   Temp:    98.7 F (37.1 C)  TempSrc:    Oral  SpO2: 99% 99% 99% 99%  Weight:      Height:       Splint CDI. Exposed toes, she is able to dorsiflex and plantarflex. SILT left foot all distributions    Lab Results  Component Value Date   WBC 14.5 (H) 11/30/2020   HGB 12.0 11/30/2020   HCT 36.9 11/30/2020   MCV 96.1 11/30/2020   PLT 211 11/30/2020     Assessment/Plan:  45 year old female with left tib/fib fracture after trip and fall -Plan for operating room for left tibial nail/ORIF fibula/ankle -NPO for OR -Holding DVT ppx -Ancef 2g preop  Leonid Manus 11/30/2020, 7:27 AM

## 2020-11-30 NOTE — Anesthesia Preprocedure Evaluation (Addendum)
Anesthesia Evaluation  Patient identified by MRN, date of birth, ID band Patient awake    Reviewed: Allergy & Precautions, NPO status , Patient's Chart, lab work & pertinent test results  History of Anesthesia Complications Negative for: history of anesthetic complications  Airway Mallampati: II  TM Distance: >3 FB Neck ROM: Full    Dental  (+) Missing,    Pulmonary neg pulmonary ROS,    Pulmonary exam normal        Cardiovascular negative cardio ROS Normal cardiovascular exam     Neuro/Psych negative neurological ROS  negative psych ROS   GI/Hepatic negative GI ROS, Neg liver ROS,   Endo/Other  negative endocrine ROS  Renal/GU negative Renal ROS  negative genitourinary   Musculoskeletal Left tib fib fracture   Abdominal   Peds  Hematology negative hematology ROS (+)   Anesthesia Other Findings Day of surgery medications reviewed with patient.  Reproductive/Obstetrics negative OB ROS                            Anesthesia Physical Anesthesia Plan  ASA: 1  Anesthesia Plan: General   Post-op Pain Management: GA combined w/ Regional for post-op pain   Induction: Intravenous  PONV Risk Score and Plan: 3 and Treatment may vary due to age or medical condition, Ondansetron, Dexamethasone, Midazolam and Scopolamine patch - Pre-op  Airway Management Planned: LMA  Additional Equipment: None  Intra-op Plan:   Post-operative Plan: Extubation in OR  Informed Consent: I have reviewed the patients History and Physical, chart, labs and discussed the procedure including the risks, benefits and alternatives for the proposed anesthesia with the patient or authorized representative who has indicated his/her understanding and acceptance.     Dental advisory given  Plan Discussed with: CRNA  Anesthesia Plan Comments:        Anesthesia Quick Evaluation

## 2020-11-30 NOTE — Anesthesia Procedure Notes (Signed)
Anesthesia Regional Block: Adductor canal block   Pre-Anesthetic Checklist: , timeout performed,  Correct Patient, Correct Site, Correct Laterality,  Correct Procedure, Correct Position, site marked,  Risks and benefits discussed,  Pre-op evaluation,  At surgeon's request and post-op pain management  Laterality: Left  Prep: Maximum Sterile Barrier Precautions used, chloraprep       Needles:  Injection technique: Single-shot  Needle Type: Echogenic Stimulator Needle     Needle Length: 9cm  Needle Gauge: 22     Additional Needles:   Procedures:,,,, ultrasound used (permanent image in chart),,    Narrative:  Start time: 11/30/2020 3:19 PM End time: 11/30/2020 3:21 PM Injection made incrementally with aspirations every 5 mL.  Performed by: Personally  Anesthesiologist: Kaylyn Layer, MD  Additional Notes: Risks, benefits, and alternative discussed. Patient gave consent for procedure. Patient prepped and draped in sterile fashion. Sedation administered, patient remains easily responsive to voice. Relevant anatomy identified with ultrasound guidance. Local anesthetic given in 5cc increments with no signs or symptoms of intravascular injection. No pain or paraesthesias with injection. Patient monitored throughout procedure with signs of LAST or immediate complications. Tolerated well. Ultrasound image placed in chart.  Amalia Greenhouse, MD

## 2020-12-01 MED ORDER — IBUPROFEN 800 MG PO TABS: 800.0000 mg | ORAL_TABLET | Freq: Three times a day (TID) | ORAL | 0 refills | Status: AC

## 2020-12-01 MED ORDER — OXYCODONE HCL 5 MG PO CAPS: 5.0000 mg | ORAL_CAPSULE | ORAL | 0 refills | Status: AC | PRN

## 2020-12-01 MED ORDER — ACETAMINOPHEN 500 MG PO TABS: 500.0000 mg | ORAL_TABLET | Freq: Three times a day (TID) | ORAL | 0 refills | Status: AC

## 2020-12-01 MED ORDER — ASPIRIN EC 325 MG PO TBEC: 325.0000 mg | DELAYED_RELEASE_TABLET | Freq: Every day | ORAL | 0 refills | Status: AC

## 2020-12-01 NOTE — Discharge Instructions (Signed)
     Discharge Instructions    Attending Surgeon: Huel Cote, MD Office Phone Number: [phone]   Diagnosis and Procedures:    Surgeries Performed: Left tibia/fibular nailing  Discharge Plan:    Diet: Resume usual diet. Begin with light or bland foods.  Drink plenty of fluids.  Activity:  Touch down weightbearing, utilizing crutches, until seen at postoperative Physical Therapy visit this week. Please keep your brace locked until follow-up. You are advised to go home directly from the hospital or surgical center. Restrict your activities.  GENERAL INSTRUCTIONS: 1.  Keep your surgical site elevated above your heart for at least 5-7 days or longer to prevent swelling. This will improve your comfort and your overall recovery following surgery.     2. Please call Dr. Serena Croissant office at [phone] with questions Monday-Friday during business hours. If no one answers, please leave a message and someone should get back to the patient within 24 hours. For emergencies please call 911 or proceed to the emergency room.   3. Patient to notify surgical team if experiences any of the following: Bowel/Bladder dysfunction, uncontrolled pain, nerve/muscle weakness, incision with increased drainage or redness, nausea/vomiting and Fever greater than 101.0 F.  Be alert for signs of infection including redness, streaking, odor, fever or chills. Be alert for excessive pain or bleeding and notify your surgeon immediately.  WOUND INSTRUCTIONS:   Leave your dressing/cast/splint in place until your post operative visit.  Keep it clean and dry.  Always keep the incision clean and dry until the staples/sutures are removed. If there is no drainage from the incision you should keep it open to air. If there is drainage from the incision you must keep it covered at all times until the drainage stops  Do not soak in a bath tub, hot tub, pool, lake or other body of water until 21 days after your surgery  and your incision is completely dry and healed.  If you have removable sutures (or staples) they must be removed 10-14 days (unless otherwise instructed) from the day of your surgery.     1)  Elevate the extremity as much as possible.  2)  Keep the dressing clean and dry.  3)  Please call us if the dressing becomes wet or dirty.  4)  If you are experiencing worsening pain or worsening swelling, please call.     MEDICATIONS: Resume all previous home medications at the previous prescribed dose and frequency unless otherwise noted Start taking the  pain medications on an as-needed basis as prescribed  Please taper down pain medication over the next week following surgery.  Ideally you should not require a refill of any narcotic pain medication.  Take pain medication with food to minimize nausea. In addition to the prescribed pain medication, you may take over-the-counter pain relievers such as Tylenol.  Do NOT take additional tylenol if your pain medication already has tylenol in it.  Aspirin 325mg  daily for four weeks.      FOLLOWUP INSTRUCTIONS: 1. Follow up at the Physical Therapy Clinic 3-4 days following surgery. This appointment should be scheduled unless other arrangements have been made.The Physical Therapy scheduling number is [phone] if an appointment has not already been arranged.  2. Contact Dr. [phone] office during office hours at [phone] or the practice after hours line at [phone] for non-emergencies. For medical emergencies call 911.   Discharge Location: Home

## 2020-12-01 NOTE — TOC Initial Note (Signed)
Transition of Care [facility]) - Initial/Assessment Note    Patient Details  Name: Janet Dalton MRN: [id number] Date of Birth: 1975/04/19  [facility] CM/SW Contact:    Kingsley Plan, RN Phone Number: 12/01/2020, 2:20 PM  Clinical Narrative:                 PAtient from home with husband .   PT recommending walker, 3 in 1 and wheel chair. NCM will order DME with [employer] . [employer] will "run insurance" and when they bring DME to hospital room they will explain insurance coverage / her responsibility. Patient voiced understanding.  NCM ordered with Velna Hatchet with [employer]    Expected Discharge Plan: Home/Self Care     Patient Goals and CMS Choice Patient states their goals for this hospitalization and ongoing recovery are:: to return to home CMS Medicare.gov Compare Post Acute Care list provided to:: Patient Choice offered to / list presented to : Patient  Expected Discharge Plan and Services Expected Discharge Plan: Home/Self Care   Discharge Planning Services: CM Consult Post Acute Care Choice: Durable Medical Equipment Living arrangements for the past 2 months: Single Family Home                 DME Arranged: 3-N-1, Walker rolling, Wheelchair manual DME Agency: [employer] Date DME Agency Contacted: 12/01/20 Time DME Agency Contacted: [phone] Representative spoke with at DME Agency: Velna Hatchet HH Arranged: NA          Prior Living Arrangements/Services Living arrangements for the past 2 months: Single Family Home Lives with:: Spouse Patient language and need for interpreter reviewed:: Yes        Need for Family Participation in Patient Care: Yes (Comment) Care giver support system in place?: Yes (comment)   Criminal Activity/Legal Involvement Pertinent to Current Situation/Hospitalization: No - Comment as needed  Activities of Daily Living      Permission Sought/Granted   Permission granted to share information with : No              Emotional  Assessment Appearance:: Appears stated age Attitude/Demeanor/Rapport: Engaged Affect (typically observed): Accepting Orientation: : Oriented to Self, Oriented to Place, Oriented to  Time, Oriented to Situation Alcohol / Substance Use: Not Applicable Psych Involvement: No (comment)  Admission diagnosis:  Left tibial fracture [S82.202A] Closed fracture of distal end of left tibia, unspecified fracture morphology, initial encounter [S82.302A] Closed fracture of distal end of fibula, unspecified fracture morphology, initial encounter [W41.324M] Patient Active Problem List   Diagnosis Date Noted   Left tibial fracture 11/29/2020   PCP:  [facility]:   [facility] [address] [place] - [address] [place] [address] Phone: [phone] Fax: [phone]     Social Determinants of Health (SDOH) Interventions    Readmission Risk Interventions No flowsheet data found.

## 2020-12-01 NOTE — Progress Notes (Signed)
   Subjective:  Patient reports pain as mild. Was able to sleep overnight. Had large void this am. Tolerating diet.  Objective:   VITALS:   Vitals:   11/30/20 2158 12/01/20 0005 12/01/20 0500 12/01/20 0751  BP: 104/66 (!) 105/59 (!) 99/56 (!) 86/51  Pulse: 76 82 77 70  Resp: 17 17 17 19   Temp: 98.5 F (36.9 C) 98.8 F (37.1 C) 98.7 F (37.1 C) 98.3 F (36.8 C)  TempSrc: Oral Oral Oral Oral  SpO2: 100% 99% 100% 99%  Weight:      Height:        Splint in Place, CDI. Nerve block in effect. All exposed toes are WWP.   Lab Results  Component Value Date   WBC 14.5 (H) 11/30/2020   HGB 12.0 11/30/2020   HCT 36.9 11/30/2020   MCV 96.1 11/30/2020   PLT 211 11/30/2020     Assessment/Plan:  1 Day Post-Op   - Patient to work with PT/OT to optimize mobilization safely - DVT ppx - SCDs, ambulation, Aspirin 325 mg - Postoperative Abx: Ancef x 2 additional doses given - touchdown weight bearing operative extremity - Pain control - multimodal pain management, ATC acetaminophen in conjunction with as needed narcotic (oxycodone), although this should be minimized with other modalities  - Discharge planning pending CM, appreciate coordination   Melisssa Donner 12/01/2020, 8:23 AM

## 2020-12-01 NOTE — Discharge Summary (Signed)
Patient ID: Janet Dalton MRN: [id number] DOB/AGE: Jul 23, 1975 45 y.o.  Admit date: 11/29/2020 Discharge date: 12/01/2020  Admission Diagnoses:  <principal problem not specified>  Discharge Diagnoses:  Active Problems:   Left tibial fracture   History reviewed. No pertinent past medical history.  Surgeries: Procedure(s): INTRAMEDULLARY (IM) NAIL TIBIAL AND TIBIAL PLATE on 78/67/6720   Consultants (if any):   Discharged Condition: Improved  Hospital Course: Janet Dalton is an 45 y.o. female who was admitted 11/29/2020 with a diagnosis of <principal problem not specified> and went to the operating room on 11/30/2020 and underwent the above named procedures.    She was given perioperative antibiotics:  Anti-infectives (From admission, onward)    Start     Dose/Rate Route Frequency Ordered Stop   11/30/20 2345  ceFAZolin (ANCEF) IVPB 2g/100 mL premix        2 g 200 mL/hr over 30 Minutes Intravenous Every 8 hours 11/30/20 2246 12/01/20 0536   11/30/20 1711  ceFAZolin (ANCEF) 2-4 GM/100ML-% IVPB       Note to Pharmacy: Tawanna Sat   : cabinet override      11/30/20 1711 12/01/20 0514     .  She was given sequential compression devices, early ambulation, and appropriate chemoprophylaxis for DVT prophylaxis.  She benefited maximally from the hospital stay and there were no complications.    Recent vital signs:  Vitals:   12/01/20 1143 12/01/20 1554  BP: (!) 85/43 101/71  Pulse: 87 81  Resp: 18 19  Temp: 99.1 F (37.3 C) 98.2 F (36.8 C)  SpO2: 99% 100%    Recent laboratory studies:  Lab Results  Component Value Date   HGB 12.0 11/30/2020   Lab Results  Component Value Date   WBC 14.5 (H) 11/30/2020   PLT 211 11/30/2020   No results found for: INR Lab Results  Component Value Date   NA 136 11/30/2020   K 3.4 (L) 11/30/2020   CL 107 11/30/2020   CO2 20 (L) 11/30/2020   BUN 10 11/30/2020   CREATININE 0.64 11/30/2020   GLUCOSE 101 (H) 11/30/2020     Discharge Medications:   Allergies as of 12/01/2020   No Known Allergies      Medication List     TAKE these medications    acetaminophen 500 MG tablet Commonly known as: TYLENOL Take 1 tablet (500 mg total) by mouth every 8 (eight) hours for 10 days.   aspirin EC 325 MG tablet Take 1 tablet (325 mg total) by mouth daily.   ibuprofen 800 MG tablet Commonly known as: ADVIL Take 1 tablet (800 mg total) by mouth every 8 (eight) hours for 10 days. Please take with food, please alternate with acetaminophen What changed:  medication strength how much to take when to take this reasons to take this additional instructions   oxycodone 5 MG capsule Commonly known as: OXY-IR Take 1 capsule (5 mg total) by mouth every 4 (four) hours as needed (severe pain).               Durable Medical Equipment  (From admission, onward)           Start     Ordered   12/01/20 1404  For home use only DME Walker rolling  Once       Question Answer Comment  Walker: With 5 Inch Wheels   Patient needs a walker to treat with the following condition Weakness      12/01/20 1404  12/01/20 1404  For home use only DME standard manual wheelchair with seat cushion  Once       Comments: Patient suffers from Intramedullary nailing left tibia,   Open treatment of bimalleolar ankle fracture   Removal of foreign body left knee which impairs their ability to perform daily activities like ambulating  in the home.  A cane  will not resolve issue with performing activities of daily living. A wheelchair will allow patient to safely perform daily activities. Patient can safely propel the wheelchair in the home or has a caregiver who can provide assistance. Length of need 6 months . Accessories: elevating leg rests (ELRs), wheel locks, extensions and anti-tippers.  Seat and back cushions   12/01/20 1404   12/01/20 1403  For home use only DME 3 n 1  Once        12/01/20 1404             Diagnostic Studies: DG Tibia/Fibula Left  Result Date: 11/30/2020 CLINICAL DATA:  Internal fixation EXAM: LEFT TIBIA AND FIBULA - 2 VIEW COMPARISON:  11/29/2020 FINDINGS: Multiple intraoperative spot images demonstrate internal fixation across the distal tibial fracture. Anatomic alignment. No hardware or bony complicating feature. Continued mild displacement at the comminuted distal fibular fracture. IMPRESSION: Internal fixation across the distal tibial fracture. Continued mild displacement across the distal fibular fracture. Electronically Signed   By: Charlett Nose M.D.   On: 11/30/2020 19:38   DG Tibia/Fibula Left  Result Date: 11/29/2020 CLINICAL DATA:  Fall EXAM: LEFT TIBIA AND FIBULA - 2 VIEW COMPARISON:  None. FINDINGS: Comminuted fractures noted in the distal left tibial and fibular metadiaphysis. No subluxation or dislocation. On the lateral view, the tibial fracture appears to extend into the posterior malleolus and ankle joint. IMPRESSION: Highly comminuted distal left tibia and fibular fractures with extension into the tibiotalar joint. Electronically Signed   By: Charlett Nose M.D.   On: 11/29/2020 19:27   DG Ankle Complete Left  Result Date: 11/29/2020 CLINICAL DATA:  Fall EXAM: LEFT ANKLE COMPLETE - 3+ VIEW COMPARISON:  None. FINDINGS: Comminuted fractures noted in the distal left tibial and fibular metadiaphysis. No subluxation or dislocation. On the lateral view, the tibial fracture appears to extend into the posterior malleolus and ankle joint. IMPRESSION: Highly comminuted distal left tibia and fibular fractures with extension into the tibiotalar joint. Electronically Signed   By: Charlett Nose M.D.   On: 11/29/2020 19:28   CT ANKLE LEFT WO CONTRAST  Result Date: 11/29/2020 CLINICAL DATA:  Recent slip and fall with known distal tibial and fibular fractures EXAM: CT OF THE LEFT ANKLE WITHOUT CONTRAST TECHNIQUE: Multidetector CT imaging of the left ankle was performed  according to the standard protocol. Multiplanar CT image reconstructions were also generated. COMPARISON:  Plain film from earlier in the same day. FINDINGS: Bones/Joint/Cartilage Comminuted fracture involving the distal tibia in the distal diaphysis and metadiaphysis is seen with extension inferiorly into the posterior malleolus. Additionally an avulsion from the lateral aspect of the tibial plafond is noted. Comminuted distal fibular fracture involving the metadiaphysis is noted as well. A small avulsion from the distal aspect of the lateral malleolus is noted medially. The talus appears intact. Visualized calcaneus and remaining tarsal bones appear intact as well. Ligaments Suboptimally assessed by CT. Muscles and Tendons Surrounding musculature appears within normal limits. No sizable hematoma is seen. Soft tissues Surrounding soft tissues demonstrate mild edema consistent with the recent injury. No sizable hematoma is noted. IMPRESSION: Comminuted fractures  of the distal tibia and fibula as described. No talar dislocation is noted. No talar fracture is identified although the tibial fracture fragments extend into the tibiotalar joint in multiple locations. Electronically Signed   By: Alcide Clever M.D.   On: 11/29/2020 22:33   DG C-Arm 1-60 Min-No Report  Result Date: 11/30/2020 Fluoroscopy was utilized by the requesting physician.  No radiographic interpretation.   DG C-Arm 1-60 Min-No Report  Result Date: 11/30/2020 Fluoroscopy was utilized by the requesting physician.  No radiographic interpretation.    Disposition: Discharge disposition: 01-Home or Self Care          Follow-up Information     Huel Cote, MD Follow up in 2 week(s).   Specialty: Orthopedic Surgery Contact information: [address] [place] [address] [phone]         [facility] Follow up.   Why: Company equipment was ordered through [employer] information: [address] [place] [address] [phone]                  Signed: Huel Cote 12/01/2020, 9:28 PM

## 2020-12-01 NOTE — Progress Notes (Addendum)
Physical Therapy Treatment Patient Details Name: Janet Dalton MRN: [id number] DOB: 24-Aug-1975 Today's Date: 12/01/2020   History of Present Illness 45 y.o. female presenting to ED after slipping over acorns outside. Imaging (+) for comminuted fractures of distal tib/fib s/p IM nailing of L tibia and ORIF of bimalleolar ankle fracture. Patient also found to have L knee lateral extra-articular calcification/body s/p removal during procedure above. No significant PMHx noted.    PT Comments    Patient progressing well this session; treatment focused on stair training and gait training. Tolerated stair training with Min guard assist and use of rail and crutch; recommend spouse be present for safety when negotiating stairs. Improved ambulation distance this session with IV out as pt now able to push through hand. Reviewed there ex. Safe to d/c home with support of family and DME. Will follow if still in the hospital.    Recommendations for follow up therapy are one component of a multi-disciplinary discharge planning process, led by the attending physician.  Recommendations may be updated based on patient status, additional functional criteria and insurance authorization.  Follow Up Recommendations  No PT follow up;Supervision - Intermittent     Equipment Recommendations  Rolling walker with 5" wheels;3in1 (PT);Wheelchair (measurements PT);Wheelchair cushion (measurements PT)    Recommendations for Other Services       Precautions / Restrictions Precautions Precautions: Fall Required Braces or Orthoses: Other Brace Other Brace: CAM boot Restrictions Weight Bearing Restrictions: Yes LLE Weight Bearing: Touchdown weight bearing     Mobility  Bed Mobility Overal bed mobility: Needs Assistance Bed Mobility: Supine to Sit;Sit to Supine     Supine to sit: Modified independent (Device/Increase time);HOB elevated Sit to supine: Modified independent (Device/Increase time);HOB elevated    General bed mobility comments: Able to get into bed/out of bed using UEs to manually move LLE. No assist needed.    Transfers Overall transfer level: Needs assistance Equipment used: Rolling walker (2 wheeled) Transfers: Sit to/from Stand Sit to Stand: Supervision         General transfer comment: SUpervision for safety. Stood from chair x2, from toilet x1, from EOB x1, cues for hand placement.  Ambulation/Gait Ambulation/Gait assistance: Supervision Gait Distance (Feet): 100 Feet Assistive device: Rolling walker (2 wheeled) Gait Pattern/deviations: Step-to pattern ("hop to") Gait velocity: decreased Gait velocity interpretation: 1.31 - 2.62 ft/sec, indicative of limited community ambulator General Gait Details: Hop to gait pattern with LLE anterior due to comfort level and pain with knee flexion; cues for sequencing with RW as pt tends to hop too far forward in RW.   Stairs Stairs: Yes Stairs assistance: Min guard Stair Management: Step to pattern;With crutches;One rail Left Number of Stairs: 2 General stair comments: Cues for technique/safety with rail and crutch.   Wheelchair Mobility    Modified Rankin (Stroke Patients Only)       Balance Overall balance assessment: Needs assistance Sitting-balance support: Feet supported;No upper extremity supported Sitting balance-Leahy Scale: Good     Standing balance support: During functional activity Standing balance-Leahy Scale: Fair Standing balance comment: Requires UE support due to TDWB status                            Cognition Arousal/Alertness: Awake/alert Behavior During Therapy: WFL for tasks assessed/performed Overall Cognitive Status: Within Functional Limits for tasks assessed  Exercises      General Comments General comments (skin integrity, edema, etc.): Reviewed HEP and safety/mobility for home.      Pertinent Vitals/Pain Pain  Assessment: Faces Pain Score: 4  Faces Pain Scale: Hurts even more Pain Location: Lft knee Pain Descriptors / Indicators: Sore;Aching Pain Intervention(s): Monitored during session;Repositioned    Home Living Family/patient expects to be discharged to:: Private residence Living Arrangements: Spouse/significant other;Children;Other relatives (68 and 50 year old, parents) Available Help at Discharge: Family;Available PRN/intermittently Type of Home: House Home Access: Stairs to enter Entrance Stairs-Rails: Right Home Layout: Two level;Able to live on main level with bedroom/bathroom Home Equipment: Crutches      Prior Function Level of Independence: Independent      Comments: Works as Artist; 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   PT Goals (current goals can now be found in the care plan section) Acute Rehab PT Goals Patient Stated Goal: To return home. PT Goal Formulation: With patient Time For Goal Achievement: 12/15/20 Potential to Achieve Goals: Good Progress towards PT goals: Progressing toward goals    Frequency    Min 5X/week      PT Plan Current plan remains appropriate    Co-evaluation              AM-PAC PT "6 Clicks" Mobility   Outcome Measure  Help needed turning from your back to your side while in a flat bed without using bedrails?: None Help needed moving from lying on your back to sitting on the side of a flat bed without using bedrails?: None Help needed moving to and from a bed to a chair (including a wheelchair)?: A Little Help needed standing up from a chair using your arms (e.g., wheelchair or bedside chair)?: A Little Help needed to walk in hospital room?: A Little Help needed climbing 3-5 steps with a railing? : A Little 6 Click Score: 20    End of Session Equipment Utilized During Treatment: Gait belt Activity Tolerance: Patient tolerated treatment well Patient left: in bed;with call bell/phone within reach Nurse Communication: Mobility  status PT Visit Diagnosis: Pain;Difficulty in walking, not elsewhere classified (R26.2) Pain - Right/Left: Left Pain - part of body: Knee     Time: 1441-1500 PT Time Calculation (min) (ACUTE ONLY): 19 min  Charges:  $Gait Training: 8-22 mins                     Vale Haven, PT, DPT Acute Rehabilitation Services Pager [phone] Office [phone]      Blake Divine A Linder Prajapati 12/01/2020, 4:06 PM

## 2020-12-01 NOTE — Care Management (Signed)
    Durable Medical Equipment  (From admission, onward)           Start     Ordered   12/01/20 1404  For home use only DME Walker rolling  Once       Question Answer Comment  Walker: With 5 Inch Wheels   Patient needs a walker to treat with the following condition Weakness      12/01/20 1404   12/01/20 1404  For home use only DME standard manual wheelchair with seat cushion  Once       Comments: Patient suffers from Intramedullary nailing left tibia,   Open treatment of bimalleolar ankle fracture   Removal of foreign body left knee which impairs their ability to perform daily activities like ambulating  in the home.  A cane  will not resolve issue with performing activities of daily living. A wheelchair will allow patient to safely perform daily activities. Patient can safely propel the wheelchair in the home or has a caregiver who can provide assistance. Length of need 6 months . Accessories: elevating leg rests (ELRs), wheel locks, extensions and anti-tippers.  Seat and back cushions   12/01/20 1404   12/01/20 1403  For home use only DME 3 n 1  Once        12/01/20 1404

## 2020-12-01 NOTE — Evaluation (Signed)
Physical Therapy Evaluation Patient Details Name: Janet Dalton MRN: [id number] DOB: November 13, 1975 Today's Date: 12/01/2020  History of Present Illness  45 y.o. female presenting to ED after slipping over acorns outside. Imaging (+) for comminuted fractures of distal tib/fib s/p IM nailing of L tibia and ORIF of bimalleolar ankle fracture. Patient also found to have L knee lateral extra-articular calcification/body s/p removal during procedure above. No significant PMHx noted.  Clinical Impression  Patient presents with pain, decreased sensation and post surgical deficits of LLE s/p above surgery. Pt independent PTA and lives with family/kids. Today, pt requires Min A-Min guard for mobility. Used crutches vs RW for gait training and balance improved with use of RW. Pt having hard time putting weight through left hand due to IV placement/pain. Education re: positioning, elevation, WB status etc. Will need to negotiate stairs prior to d/c home and will plan to do that in the afternoon session. Recommend BSC, RW and possibly w/c pending prices. Will follow acutely to maximize independence and mobility prior to return home.     Recommendations for follow up therapy are one component of a multi-disciplinary discharge planning process, led by the attending physician.  Recommendations may be updated based on patient status, additional functional criteria and insurance authorization.  Follow Up Recommendations No PT follow up;Supervision - Intermittent    Equipment Recommendations  Rolling walker with 5" wheels;3in1 (PT);Wheelchair (measurements PT);Wheelchair cushion (measurements PT) (w/c pending price)    Recommendations for Other Services       Precautions / Restrictions Precautions Precautions: Fall Required Braces or Orthoses: Other Brace Other Brace: CAM boot Restrictions Weight Bearing Restrictions: Yes LLE Weight Bearing: Touchdown weight bearing      Mobility  Bed Mobility Overal bed  mobility: Needs Assistance Bed Mobility: Supine to Sit     Supine to sit: Min assist;HOB elevated     General bed mobility comments: Assist with managing LLE to get to EOB; manually using UEs, difficulty pushing through left hand due to IV placement.    Transfers Overall transfer level: Needs assistance Equipment used: Rolling walker (2 wheeled);Crutches Transfers: Sit to/from Stand Sit to Stand: Min guard         General transfer comment: Min guard for safety. Cues for technique on how to stand with crutches vs RW and for hand placement. Stood from [employer], from chair x1, able to maintain WB status.  Ambulation/Gait Ambulation/Gait assistance: Min guard Gait Distance (Feet): 15 Feet (+ 8') Assistive device: Rolling walker (2 wheeled);Crutches Gait Pattern/deviations: Step-to pattern ("hop to") Gait velocity: decreased Gait velocity interpretation: <1.31 ft/sec, indicative of household ambulator General Gait Details: Hop to gait pattern with use of crutches, very short step length, not placing weight through left hand due to pain from IV, slow and unsteady; hop to with RW, better able to balance/mobilize, not able to bring LLE posteriorly due to pain in knee with flexion.  Stairs            Wheelchair Mobility    Modified Rankin (Stroke Patients Only)       Balance Overall balance assessment: Needs assistance Sitting-balance support: Feet supported;No upper extremity supported Sitting balance-Leahy Scale: Good     Standing balance support: During functional activity Standing balance-Leahy Scale: Poor Standing balance comment: Requires UE support due to TDWB status                             Pertinent Vitals/Pain Pain Assessment: Faces  Pain Score: 4  Faces Pain Scale: Hurts even more Pain Location: Lft knee Pain Descriptors / Indicators: Sore;Aching Pain Intervention(s): Limited activity within patient's tolerance;Monitored during  session;Premedicated before session;Repositioned    Home Living Family/patient expects to be discharged to:: Private residence Living Arrangements: Spouse/significant other;Children;Other relatives (9 and 84 year old, parents) Available Help at Discharge: Family;Available PRN/intermittently Type of Home: House Home Access: Stairs to enter Entrance Stairs-Rails: Right Entrance Stairs-Number of Steps: 6 thru garage, 4 from front Home Layout: Two level;Able to live on main level with bedroom/bathroom Home Equipment: Crutches      Prior Function Level of Independence: Independent         Comments: Works as Artist; 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     Hand Dominance   Dominant Hand: Right    Extremity/Trunk Assessment   Upper Extremity Assessment Upper Extremity Assessment: Defer to OT evaluation    Lower Extremity Assessment Lower Extremity Assessment: LLE deficits/detail LLE Deficits / Details: Decreased knee flexion secondary to pain, able to wiggle toes. LLE: Unable to fully assess due to immobilization LLE Sensation: decreased light touch    Cervical / Trunk Assessment Cervical / Trunk Assessment: Normal  Communication   Communication: No difficulties  Cognition Arousal/Alertness: Awake/alert Behavior During Therapy: WFL for tasks assessed/performed Overall Cognitive Status: Within Functional Limits for tasks assessed                                        General Comments General comments (skin integrity, edema, etc.): VSS on RA.    Exercises     Assessment/Plan    PT Assessment Patient needs continued PT services  PT Problem List Decreased range of motion;Decreased mobility;Decreased activity tolerance;Decreased skin integrity;Pain;Impaired sensation;Decreased balance;Decreased knowledge of use of DME       PT Treatment Interventions Therapeutic exercise;DME instruction;Gait training;Balance training;Stair training;Patient/family  education;Therapeutic activities;Functional mobility training;Wheelchair mobility training    PT Goals (Current goals can be found in the Care Plan section)  Acute Rehab PT Goals Patient Stated Goal: To return home. PT Goal Formulation: With patient Time For Goal Achievement: 12/15/20 Potential to Achieve Goals: Good    Frequency Min 5X/week   Barriers to discharge Inaccessible home environment stairs    Co-evaluation               AM-PAC PT "6 Clicks" Mobility  Outcome Measure Help needed turning from your back to your side while in a flat bed without using bedrails?: A Little Help needed moving from lying on your back to sitting on the side of a flat bed without using bedrails?: A Little Help needed moving to and from a bed to a chair (including a wheelchair)?: A Little Help needed standing up from a chair using your arms (e.g., wheelchair or bedside chair)?: A Little Help needed to walk in hospital room?: A Little Help needed climbing 3-5 steps with a railing? : A Little 6 Click Score: 18    End of Session Equipment Utilized During Treatment: Gait belt Activity Tolerance: Patient limited by pain Patient left: in chair;with call bell/phone within reach;with nursing/sitter in room Nurse Communication: Mobility status PT Visit Diagnosis: Pain Pain - Right/Left: Left Pain - part of body: Leg;Knee    Time: 0177-9390 PT Time Calculation (min) (ACUTE ONLY): 38 min   Charges:   PT Evaluation $PT Eval Moderate Complexity: 1 Mod PT Treatments $Gait Training: 8-22 mins $Therapeutic  Activity: 8-22 mins        Vale Haven, PT, DPT Acute Rehabilitation Services Pager [phone] Office [phone]     Marcy Panning 12/01/2020, 12:24 PM

## 2020-12-01 NOTE — Progress Notes (Signed)
Afternoon treatment session with focus on bed mobility and ADL transfers with AE/AD/DME. Patient able to utilize gait belt to assist with advancement of LLE from bed surface to EOB. Total A for transport to ortho gym on 5N for walk-in shower transfers. Able to return demo with RW and Min guard. All patient questions answered to patient satisfaction. Handoff to PT for stair training at conclusion of session.    12/01/20 1400  OT Visit Information  Last OT Received On 12/01/20  Assistance Needed +1  History of Present Illness 45 y.o. female presenting to ED after slipping over acorns outside. Imaging (+) for comminuted fractures of distal tib/fib s/p IM nailing of L tibia and ORIF of bimalleolar ankle fracture. Patient also found to have L knee lateral extra-articular calcification/body s/p removal during procedure above. No significant PMHx noted.  Precautions  Precautions Fall  Required Braces or Orthoses Other Brace  Other Brace CAM boot  Pain Assessment  Pain Assessment 0-10  Pain Score 4  Pain Location Lft knee  Pain Descriptors / Indicators Sore;Aching  Pain Intervention(s) Limited activity within patient's tolerance;Monitored during session;Repositioned;Premedicated before session  Cognition  Arousal/Alertness Awake/alert  Behavior During Therapy WFL for tasks assessed/performed  Overall Cognitive Status Within Functional Limits for tasks assessed  ADL  Overall ADL's  Needs assistance/impaired  Lower Body Dressing Minimal assistance;Sitting/lateral leans;Sit to/from stand  Lower Body Dressing Details (indicate cue type and reason) Min A to doff CAM boot.  Tub/ Therapist, sports Details (indicate cue type and reason) Simulated walk-in shower transfer with use of RW and 3-in-1.  Bed Mobility  Overal bed mobility Needs Assistance  Bed Mobility Supine to Sit  Supine to sit Min guard  General bed mobility comments Min guard with use of gait  belt as leg lifter. Able to control descent of LLE without external assist.  Restrictions  Weight Bearing Restrictions Yes  LLE Weight Bearing TWB  Transfers  Overall transfer level Needs assistance  Equipment used Rolling walker (2 wheeled);Crutches  Transfers Sit to/from Stand  Sit to [employer] guard  OT - End of Session  Equipment Utilized During Treatment Gait belt;Rolling walker  Activity Tolerance Patient tolerated treatment well  Patient left Other (comment) (Handoff to PT in ortho gym.)  Nurse Communication Mobility status  OT Assessment/Plan  OT Plan Discharge plan remains appropriate;Frequency remains appropriate  OT Visit Diagnosis Unsteadiness on feet (R26.81);Pain  Pain - Right/Left Left  Pain - part of body Knee;Leg  OT Frequency (ACUTE ONLY) Min 2X/week  Follow Up Recommendations No OT follow up;Supervision - Intermittent  OT Equipment 3 in 1 bedside commode;Other (comment) (rolling walker)  AM-PAC OT "6 Clicks" Daily Activity Outcome Measure (Version 2)  Help from another person eating meals? 4  Help from another person taking care of personal grooming? 3  Help from another person toileting, which includes using toliet, bedpan, or urinal? 3  Help from another person bathing (including washing, rinsing, drying)? 3  Help from another person to put on and taking off regular upper body clothing? 3  Help from another person to put on and taking off regular lower body clothing? 3  6 Click Score 19  Progressive Mobility  What is the highest level of mobility based on the progressive mobility assessment? Level 5 (Walks with assist in room/hall) - Balance while stepping forward/back and can walk in room with assist - Complete  Mobility Out of bed for toileting;Out of bed to chair with meals;Ambulated  with assistance in room  Acute Rehab OT Goals  Patient Stated Goal To return home.  ADL Goals  Additional ADL Goal #1 Patient will complete ADLs with Mod I and AD/DME in prep  for safe return home.  Additional ADL Goal #2 Patient will recall and demonstrate adherence to LLE precautions with I.  OT Time Calculation  OT Start Time (ACUTE ONLY) 1420  OT Stop Time (ACUTE ONLY) 1442  OT Time Calculation (min) 22 min  OT General Charges  $OT Visit 1 Visit  OT Treatments  $Therapeutic Activity 8-22 mins

## 2020-12-01 NOTE — Evaluation (Signed)
Occupational Therapy Evaluation Patient Details Name: Janet Dalton MRN: [id number] DOB: [birth year] Today's Date: 12/01/2020   History of Present Illness 45 y.o. female presenting to ED after slipping over acorns outside. Imaging (+) for comminuted fractures of distal tib/fib s/p IM nailing of L tibia and ORIF of bimalleolar ankle fracture. Patient also found to have L knee lateral extra-articular calcification/body s/p removal during procedure above. No significant PMHx noted.   Clinical Impression   PTA patient was living with her spouse in a private residence and was grossly I with ADLs/IADLs, working from home full-time and driving. Patient currently functioning below baseline demonstrating observed ADLs with Min guard to Min A grossly with use of RW. Placement of L PIV on dorsal aspect of wrist making weightbearing a hardship. Patient also limited by deficits listed below including pain in LLE and would benefit from continued acute OT services in prep for safe d/c home with family. Patient education on safety and compensatory strategies for bathing/dressing, toileting, walk-in shower transfers and use of AD/DME to maximize safety/independence. Patient expressed verbal understanding. Plan for d/c later today.        Recommendations for follow up therapy are one component of a multi-disciplinary discharge planning process, led by the attending physician.  Recommendations may be updated based on patient status, additional functional criteria and insurance authorization.   Follow Up Recommendations  No OT follow up;Supervision - Intermittent    Equipment Recommendations  3 in 1 bedside commode;Other (comment) (rolling walker)    Recommendations for Other Services       Precautions / Restrictions Precautions Precautions: Fall Required Braces or Orthoses: Other Brace Other Brace: CAM boot Restrictions Weight Bearing Restrictions: Yes LLE Weight Bearing: Touchdown weight bearing       Mobility Bed Mobility Overal bed mobility: Needs Assistance Bed Mobility: Supine to Sit     Supine to sit: Min assist;HOB elevated     General bed mobility comments: Patient seated in recliner upon entry.    Transfers Overall transfer level: Needs assistance Equipment used: Rolling walker (2 wheeled);Crutches Transfers: Sit to/from Stand Sit to Stand: Min guard         General transfer comment: Min guard for safety. Cues for technique on how to stand with crutches vs RW and for hand placement. Stood from [employer], from    Balance Overall balance assessment: Mild deficits observed, not formally tested                                         ADL either performed or assessed with clinical judgement   ADL Overall ADL's : Needs assistance/impaired     Grooming: Min guard;Standing Grooming Details (indicate cue type and reason): Hand washing standing at sink level with Min gaurd for safety. Upper Body Bathing: Set up;Sitting   Lower Body Bathing: Minimal assistance;Sitting/lateral leans;Sit to/from stand   Upper Body Dressing : Set up;Sitting   Lower Body Dressing: Minimal assistance;Sitting/lateral leans;Sit to/from stand   Toilet Transfer: Immunologist Details (indicate cue type and reason): Some discomfort at L PIV at bend of wrist limiting mobility with RW. Toileting- Architect and Hygiene: Min guard;Sitting/lateral lean;Sit to/from stand Toileting - Architect Details (indicate cue type and reason): Min guard for clothing management in standing.             Vision   Vision Assessment?: No apparent visual deficits  Perception     Praxis      Pertinent Vitals/Pain Pain Assessment: 0-10 Pain Score: 4  Faces Pain Scale: Hurts even more Pain Location: L knee Pain Descriptors / Indicators: Sore;Aching Pain Intervention(s): Limited activity within patient's tolerance;Monitored during  session;Repositioned;Premedicated before session     Hand Dominance Right   Extremity/Trunk Assessment Upper Extremity Assessment Upper Extremity Assessment: Overall WFL for tasks assessed   Lower Extremity Assessment Lower Extremity Assessment: Defer to PT evaluation LLE Deficits / Details: Decreased knee flexion secondary to pain, able to wiggle toes. LLE: Unable to fully assess due to immobilization LLE Sensation: decreased light touch   Cervical / Trunk Assessment Cervical / Trunk Assessment: Normal   Communication Communication Communication: No difficulties   Cognition Arousal/Alertness: Awake/alert Behavior During Therapy: WFL for tasks assessed/performed Overall Cognitive Status: Within Functional Limits for tasks assessed                                     General Comments  VSS on RA.    Exercises     Shoulder Instructions      Home Living Family/patient expects to be discharged to:: Private residence Living Arrangements: Spouse/significant other;Children;Other relatives (73 and 42 year old, parents) Available Help at Discharge: Family;Available PRN/intermittently Type of Home: House Home Access: Stairs to enter [employer] of Steps: 6 thru garage, 4 from front Entrance Stairs-Rails: Right Home Layout: Two level;Able to live on main level with bedroom/bathroom     Bathroom Shower/Tub: Producer, television/film/video: Standard     Home Equipment: Crutches          Prior Functioning/Environment Level of Independence: Independent        Comments: Works as Artist; 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        OT Problem List: Impaired balance (sitting and/or standing);Decreased knowledge of use of DME or AE;Pain      OT Treatment/Interventions: Self-care/ADL training;Therapeutic exercise;Energy conservation;DME and/or AE instruction;Therapeutic activities;Patient/family education;Balance training    OT Goals(Current goals  can be found in the care plan section) Acute Rehab OT Goals Patient Stated Goal: To return home. OT Goal Formulation: With patient Time For Goal Achievement: 12/15/20 Potential to Achieve Goals: Good ADL Goals Additional ADL Goal #1: Patient will complete ADLs with Mod I and AD/DME in prep for safe return home. Additional ADL Goal #2: Patient will recall and demonstrate adherence to LLE precautions with I.  OT Frequency: Min 2X/week   Barriers to D/C:            Co-evaluation              AM-PAC OT "6 Clicks" Daily Activity     Outcome Measure Help from another person eating meals?: None Help from another person taking care of personal grooming?: A Little Help from another person toileting, which includes using toliet, bedpan, or urinal?: A Little Help from another person bathing (including washing, rinsing, drying)?: A Little Help from another person to put on and taking off regular upper body clothing?: A Little Help from another person to put on and taking off regular lower body clothing?: A Little 6 Click Score: 19   End of Session Equipment Utilized During Treatment: Gait belt;Rolling walker Nurse Communication: Mobility status  Activity Tolerance: Patient tolerated treatment well Patient left: in chair;with call bell/phone within reach  OT Visit Diagnosis: Unsteadiness on feet (R26.81);Pain Pain - Right/Left: Left Pain -  part of body: Knee;Leg                Time: 3220-2542 OT Time Calculation (min): 23 min Charges:  OT General Charges $OT Visit: 1 Visit OT Evaluation $OT Eval Low Complexity: 1 Low OT Treatments $Self Care/Home Management : 8-22 mins  Talli Kimmer H. OTR/L Supplemental OT, Department of rehab services [phone]  Paloma Grange R H. 12/01/2020, 11:10 AM

## 2020-12-02 ENCOUNTER — Encounter (HOSPITAL_COMMUNITY): Payer: Self-pay | Admitting: Orthopaedic Surgery

## 2020-12-06 LAB — AEROBIC/ANAEROBIC CULTURE W GRAM STAIN (SURGICAL/DEEP WOUND): Culture: NO GROWTH

## 2020-12-07 ENCOUNTER — Other Ambulatory Visit (HOSPITAL_BASED_OUTPATIENT_CLINIC_OR_DEPARTMENT_OTHER): Payer: Self-pay | Admitting: Orthopaedic Surgery

## 2020-12-07 DIAGNOSIS — S8252XD Displaced fracture of medial malleolus of left tibia, subsequent encounter for closed fracture with routine healing: Secondary | ICD-10-CM

## 2020-12-08 ENCOUNTER — Other Ambulatory Visit (HOSPITAL_BASED_OUTPATIENT_CLINIC_OR_DEPARTMENT_OTHER): Payer: Self-pay | Admitting: Orthopaedic Surgery

## 2020-12-08 ENCOUNTER — Telehealth (HOSPITAL_BASED_OUTPATIENT_CLINIC_OR_DEPARTMENT_OTHER): Payer: Self-pay | Admitting: Orthopaedic Surgery

## 2020-12-08 DIAGNOSIS — S8252XD Displaced fracture of medial malleolus of left tibia, subsequent encounter for closed fracture with routine healing: Secondary | ICD-10-CM

## 2020-12-08 NOTE — Telephone Encounter (Signed)
Spoke to patient directly 

## 2020-12-14 ENCOUNTER — Other Ambulatory Visit: Payer: Self-pay

## 2020-12-14 ENCOUNTER — Ambulatory Visit (INDEPENDENT_AMBULATORY_CARE_PROVIDER_SITE_OTHER): Payer: 59 | Admitting: Orthopaedic Surgery

## 2020-12-14 ENCOUNTER — Telehealth: Payer: Self-pay | Admitting: Orthopaedic Surgery

## 2020-12-14 ENCOUNTER — Other Ambulatory Visit (HOSPITAL_BASED_OUTPATIENT_CLINIC_OR_DEPARTMENT_OTHER): Payer: Self-pay | Admitting: Orthopaedic Surgery

## 2020-12-14 ENCOUNTER — Ambulatory Visit (HOSPITAL_BASED_OUTPATIENT_CLINIC_OR_DEPARTMENT_OTHER)
Admission: RE | Admit: 2020-12-14 | Discharge: 2020-12-14 | Disposition: A | Discharge status: Home / Self Care | Payer: 59 | Source: Ambulatory Visit | Attending: Orthopaedic Surgery | Admitting: Orthopaedic Surgery

## 2020-12-14 DIAGNOSIS — S8252XD Displaced fracture of medial malleolus of left tibia, subsequent encounter for closed fracture with routine healing: Secondary | ICD-10-CM

## 2020-12-14 NOTE — Telephone Encounter (Signed)
FMLA forms received via email from [employer]. To [employer]. 

## 2020-12-14 NOTE — Progress Notes (Signed)
Post Operative Evaluation    Procedure/Date of Surgery: 11/30/20 -left tibial nail for tib-fib fracture  Interval History:   Presents today 2 weeks follow-up from left intramedullary nail of the tibia on 17 October.  She has been nonweightbearing in a boot since that time.  She has stopped taking the oxycodone.  She has been compliant with aspirin.    PMH/PSH/Family History/Social History/Meds/Allergies:   No past medical history on file. Past Surgical History:  Procedure Laterality Date   EYE SURGERY     KNEE SURGERY Left    TIBIA IM NAIL INSERTION Left 11/30/2020   Procedure: INTRAMEDULLARY (IM) NAIL TIBIAL AND TIBIAL PLATE;  Surgeon: Huel Cote, MD;  Location: [facility] OR;  Service: Orthopedics;  Laterality: Left;   Social History   Socioeconomic History   Marital status: Married    Spouse name: Not on file   Number of children: Not on file   Years of education: Not on file   Highest education level: Not on file  Occupational History   Not on file  Tobacco Use   Smoking status: Never   Smokeless tobacco: Never  Substance and Sexual Activity   Alcohol use: Never   Drug use: Never   Sexual activity: Not on file  Other Topics Concern   Not on file  Social History Narrative   Not on file   Social Determinants of Health   Financial Resource Strain: Not on file  Food Insecurity: Not on file  Transportation Needs: Not on file  Physical Activity: Not on file  Stress: Not on file  Social Connections: Not on file   No family history on file. No Known Allergies Current Outpatient Medications  Medication Sig Dispense Refill   aspirin EC 325 MG tablet Take 1 tablet (325 mg total) by mouth daily. 30 tablet 0   oxycodone (OXY-IR) 5 MG capsule Take 1 capsule (5 mg total) by mouth every 4 (four) hours as needed (severe pain). 20 capsule 0   No current facility-administered medications for this visit.   No results found.  Review of  Systems:   A ROS was performed including pertinent positives and negatives as documented in the HPI.   Musculoskeletal Exam:     Incisions are well-healing with no erythema or drainage.  Swelling about the ankle and knee with limited range of motion.  Imaging:    X-rays 4 views left tibia Status post intramedullary nail left tibia without evidence of hardware complication  I personally reviewed and interpreted the radiographs.   Assessment:   45 year old female status post left tibial nail 2 weeks overall doing very well.  At this time I would like her to advance to 50% weightbearing on the left.  She will continue to weight-bear in the boot but will otherwise be out of this for range of motion of the ankle.  She was specifically counseled on range of motion of the ankle.  She was also counseled her range of motion about the knee.  She will continue to outpatient therapy and will transition her to outpatient therapy when she is able to drive.  Plan :    -Return to clinic 2 weeks   I personally saw and evaluated the patient, and participated in the management and treatment plan.  Huel Cote, MD Attending Physician, Orthopedic Surgery  This document was dictated using Conservation officer, historic buildings. A reasonable attempt at proof reading has been made to minimize errors.

## 2020-12-15 ENCOUNTER — Telehealth (HOSPITAL_BASED_OUTPATIENT_CLINIC_OR_DEPARTMENT_OTHER): Payer: Self-pay | Admitting: Orthopaedic Surgery

## 2020-12-15 NOTE — Telephone Encounter (Signed)
Spoke to patient directly 

## 2020-12-18 ENCOUNTER — Telehealth: Payer: Self-pay | Admitting: Orthopaedic Surgery

## 2020-12-18 NOTE — Telephone Encounter (Signed)
Pt called stating her forms still haven't been sent and she would like a CB to discuss this please.  [phone]

## 2020-12-23 ENCOUNTER — Telehealth (HOSPITAL_BASED_OUTPATIENT_CLINIC_OR_DEPARTMENT_OTHER): Payer: Self-pay | Admitting: Orthopaedic Surgery

## 2020-12-23 NOTE — Telephone Encounter (Signed)
I relayed message to PT to schedule evaluation appt next week. Appt has been scheduled

## 2020-12-28 ENCOUNTER — Other Ambulatory Visit: Payer: Self-pay

## 2020-12-28 ENCOUNTER — Ambulatory Visit (HOSPITAL_BASED_OUTPATIENT_CLINIC_OR_DEPARTMENT_OTHER): Payer: 59 | Attending: Orthopaedic Surgery | Admitting: Physical Therapy

## 2020-12-28 ENCOUNTER — Other Ambulatory Visit (HOSPITAL_BASED_OUTPATIENT_CLINIC_OR_DEPARTMENT_OTHER): Payer: Self-pay | Admitting: Orthopaedic Surgery

## 2020-12-28 ENCOUNTER — Ambulatory Visit (INDEPENDENT_AMBULATORY_CARE_PROVIDER_SITE_OTHER): Payer: 59 | Admitting: Orthopaedic Surgery

## 2020-12-28 ENCOUNTER — Ambulatory Visit (HOSPITAL_BASED_OUTPATIENT_CLINIC_OR_DEPARTMENT_OTHER)
Admission: RE | Admit: 2020-12-28 | Discharge: 2020-12-28 | Disposition: A | Discharge status: Home / Self Care | Payer: 59 | Source: Ambulatory Visit | Attending: Orthopaedic Surgery | Admitting: Orthopaedic Surgery

## 2020-12-28 ENCOUNTER — Encounter (HOSPITAL_BASED_OUTPATIENT_CLINIC_OR_DEPARTMENT_OTHER): Payer: Self-pay | Admitting: Physical Therapy

## 2020-12-28 DIAGNOSIS — M6281 Muscle weakness (generalized): Secondary | ICD-10-CM | POA: Insufficient documentation

## 2020-12-28 DIAGNOSIS — S8252XD Displaced fracture of medial malleolus of left tibia, subsequent encounter for closed fracture with routine healing: Secondary | ICD-10-CM

## 2020-12-28 DIAGNOSIS — M25572 Pain in left ankle and joints of left foot: Secondary | ICD-10-CM | POA: Insufficient documentation

## 2020-12-28 DIAGNOSIS — R262 Difficulty in walking, not elsewhere classified: Secondary | ICD-10-CM | POA: Insufficient documentation

## 2020-12-28 DIAGNOSIS — M25672 Stiffness of left ankle, not elsewhere classified: Secondary | ICD-10-CM | POA: Insufficient documentation

## 2020-12-28 NOTE — Therapy (Signed)
OUTPATIENT PHYSICAL THERAPY LOWER EXTREMITY EVALUATION   Patient Name: Janet Dalton MRN: [id number] DOB:09/16/1975, 45 y.o., female Today's Date: 12/28/2020   PT End of Session - 12/28/20 2218     Visit Number 1    Number of Visits 20    Date for PT Re-Evaluation 03/08/21    Progress Note Due on Visit --   01/28/2021   PT Start Time 0940    PT Stop Time 1025    PT Time Calculation (min) 45 min    Activity Tolerance Patient tolerated treatment well    Behavior During Therapy [facility] for tasks assessed/performed             History reviewed. No pertinent past medical history. Past Surgical History:  Procedure Laterality Date   EYE SURGERY     KNEE SURGERY Left    TIBIA IM NAIL INSERTION Left 11/30/2020   Procedure: INTRAMEDULLARY (IM) NAIL TIBIAL AND TIBIAL PLATE;  Surgeon: Huel Cote, MD;  Location: [facility] OR;  Service: Orthopedics;  Laterality: Left;   Patient Active Problem List   Diagnosis Date Noted   Left tibial fracture 11/29/2020    PCP: Joycelyn Rua, MD  REFERRING PROVIDER: Huel Cote, MD  REFERRING DIAG: [id number] (ICD-10-CM) - Closed displaced fracture of medial malleolus of left tibia with routine healing, subsequent encounter   THERAPY DIAG:  Pain in left ankle and joints of left foot  Stiffness of left ankle, not elsewhere classified  Muscle weakness (generalized)  Difficulty in walking, not elsewhere classified  ONSET DATE:  Injury 11/29/2020 / Surgery 11/30/2020  SUBJECTIVE:   SUBJECTIVE STATEMENT: -Pt was blowing the leaves and fell when she slipped over acorns.  Pt went to ER and had x rays and CT.    X rays showed a Left distal tibia fracture with extension into the posterior malleolus as well as a distal fibula fracture.  Pt underwent left intramedullary nail of the tibia on 11/30/2020.  Pt had HHPT and has been performing home exercises.  -MD script indicated pt to be 50% weight bearing and to address ROM of knee and ankle.  MD notes  indicated to continue to weight-bear in the boot but will otherwise be out of this for range of motion of the ankle.  -She is limited with performing her ADLs/IADLs and normal functional mobility skills.  Pt is limited with ambulation and is using 2 crutches.  She is 50% Wb'ing currently and is limited with standing and performing household chores.  Pt has 2 kids and has difficulty taking care of kids currently.  Pt working half days right now and does work at home.    PERTINENT HISTORY: MD notes/orders:  50% weight bearing and continue to weight-bear in the boot.  Prior L knee surgeries x 2 (approx 7 years ago)  PAIN:  Are you having pain? Yes NRPS scale: 3/10 current, 1-2/10 best, 7/10 worst Pain location: anterior ankle, lateral ankle, and L knee Pain orientation: Left    PRECAUTIONS: Other: 50% WB'ing, WB'ing in boot and per surgery.  WEIGHT BEARING RESTRICTIONS Yes 50% WB'ing.  WB'ing in boot  FALLS:  Has patient fallen in last 6 months? 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    PLOF: Independent;  Pt ambulated without an AD.  Pt was able to ambulate community distance and perform all of her normal functional mobility skills independently without limitation.  Pt was able to take care of her kids without  difficulty.    PATIENT GOALS:  return to PLOF   OBJECTIVE:   DIAGNOSTIC FINDINGS: Pt had x rays and a CT scan.  X rays on 12/14/2020 showed ORIF distal tibial fracture and comminuted distal fibular fracture in anatomic alignment.  PATIENT SURVEYS:  FOTO 29  COGNITION:  Overall cognitive status: Within functional limits for tasks assessed     OBSERVATION:  Incisions intabt at L knee and ankle with some steri stripss over ankle incisions  GIRTH MEASUREMENTS:  R:  45.2, L:  49 cm   LE AROM/PROM:  A/PROM Right 12/28/2020 Left 12/28/2020  Hip flexion    Hip extension    Hip abduction    Hip adduction    Hip internal  rotation    Hip external rotation    Knee flexion 137 123  Knee extension 8 deg hyperext 2 deg hyperxt  Ankle dorsiflexion 12 -12/-2  Ankle plantarflexion 62 38/43  Ankle inversion 35 6/11  Ankle eversion 14 6/6   (Blank rows = not tested)  STRENGTH:  Not formally tested due to recent surgery.  Pt is able to perform supine SLR well independently.   GAIT:  Assistive device utilized: Crutches; CAM walker boot Comments: Pt ambulated well with bilat crutches and CAM walker boot following WB'ing restrictions.    TODAY'S TREATMENT: Pt performed ankle pumps x 15 reps, toe flexion and extension AROM x 15 reps, seated gastroc stretch x20 seconds, supine SLR x 10 reps, and supine heel slide x 10 reps all on L LE.   PATIENT EDUCATION:  Education details: Educated pt concerning POC and objective findings.  Answered Pt's questions.  Gave pt a HEP handout and educated pt in correct form and appropriate frequency.  Pt instructed to not perform exercises into a painful range and she should not have increased pain with HEP.  Person educated: Patient Education method: Explanation, Demonstration, Tactile cues, Verbal cues, and Handouts Education comprehension: verbalized understanding, returned demonstration, verbal cues required, and tactile cues required   HOME EXERCISE PROGRAM: Access Code: [email or web address] URL: [email or web address] Date: 12/28/2020 Prepared by: Aaron Edelman  Exercises ANKLE PUMPS - 2-3 x daily - 7 x weekly - 2 sets - 10 reps Seated Toe Curl - 3 x daily - 7 x weekly - 2-3 sets - 10 reps Long Sitting Calf Stretch with Strap - 2 x daily - 7 x weekly - 2-3 reps - 20 seconds hold Supine Heel Slide - 2 x daily - 7 x weekly - 2 sets - 10 reps Supine Active Straight Leg Raise - 1 x daily - 6-7 x weekly - 2 sets - 10 reps   ASSESSMENT:  CLINICAL IMPRESSION: Patient is a 45 y.o. female 4 weeks s/p left intramedullary nail of the tibia presenting to the clinic with  expected post op findings of L ankle pain, limited ROM in L ankle, muscle weakness in L LE, swelling, and difficulty in walking.  She is limited with performing her ADLs/IADLs and normal functional mobility skills including ambulation and stairs.  Pt is using 2 crutches with 50% WB'ing restrictions currently.  She is limited with standing and performing household chores.  Pt has difficulty taking care of kids and is working half days right now.  Pt should benefit from skilled PT to address above impairments and to assist with restoring PLOF.    Objective impairments include Abnormal gait, decreased activity tolerance, decreased endurance, decreased mobility, difficulty walking, decreased ROM, decreased strength, hypomobility, impaired flexibility, and pain. These impairments are limiting  patient from cleaning, community activity, meal prep, occupation, laundry, yard work, and shopping.    REHAB POTENTIAL: Good  CLINICAL DECISION MAKING: Stable/uncomplicated  EVALUATION COMPLEXITY: Low   GOALS:   SHORT TERM GOALS:  STG Name Target Date Goal status  1 Pt will be independent and compliant with HEP for improved pain, ROM, strength, and function.  Baseline:  01/18/2021 INITIAL  2 Pt will demo at least 8-10 deg increase in AROM of DF, PF, and Inv for improved stiffness and mobility.   Baseline:  01/25/2021 INITIAL  3 Pt will progress with WB'ing as MD allows without adverse effects for improved gait and mobility Baseline: 01/25/2021 INITIAL  4 Pt will perform a 6 inch step up with good control and form.  Baseline: 02/22/2021 INITIAL  5 Pt will be able to perform her daily transfers without difficulty. Baseline: 02/22/2021 INITIAL             LONG TERM GOALS:   LTG Name Target Date Goal status  1 Pt will ambulate with a normalized heel to toe gait pattern without limping.  Baseline: 03/08/2021 INITIAL  2 Pt will ambulate community distance without an AD without significant pain or difficulty.    Baseline: 03/08/2021 INITIAL  3 Pt will perform stairs with a reciprocal gait without rail with good control.  Baseline: 03/08/2021 INITIAL  4 Pt will demo at least 10 deg of L ankle DF AROM for improved stiffness and gait.  Baseline: 03/08/2021 INITIAL  5 Pt will demo 5/5 L ankle strength in DF, Eve, and Inv and WFL for PF (tested in NWB'ing) for improved tolerance to daily mobility and taking care of kids and to assist in restoring PLOF.  Baseline: 03/08/2021 INITIAL  6 Pt will be able to perform her normal standing activities including household chores without significant pain or difficulty.  Baseline: 03/08/2021 INITIAL        PLAN: PT FREQUENCY: 2x/week  PT DURATION: 10 weeks  PLANNED INTERVENTIONS: Therapeutic exercises, Therapeutic activity, Neuro Muscular re-education, Balance training, Gait training, Patient/Family education, Joint mobilization, Stair training, Aquatic Therapy, Electrical stimulation, Cryotherapy, Taping, and Manual therapy  PLAN FOR NEXT SESSION: Cont with ankle ROM and knee flexion PROM.  Gait training and WB'ing per MD orders.  Ice as needed to reduce swelling, soreness, and pain.     Audie Clear III PT, DPT 12/28/20 10:32 PM

## 2020-12-28 NOTE — Progress Notes (Signed)
Post Operative Evaluation    Procedure/Date of Surgery: 11/30/20 -left tibial nail for tib-fib fracture  Interval History:   Presents today for follow-up of the above procedure.  She is overall continuing to make significant advancements.  She is driving at this time.  She is not on any pain medication.  She has been ambulating with partial weightbearing and crutches on the left.  She has been doing physical therapy and today was her first in person assessment.   PMH/PSH/Family History/Social History/Meds/Allergies:   No past medical history on file. Past Surgical History:  Procedure Laterality Date   EYE SURGERY     KNEE SURGERY Left    TIBIA IM NAIL INSERTION Left 11/30/2020   Procedure: INTRAMEDULLARY (IM) NAIL TIBIAL AND TIBIAL PLATE;  Surgeon: Huel Cote, MD;  Location: [facility] OR;  Service: Orthopedics;  Laterality: Left;   Social History   Socioeconomic History   Marital status: Married    Spouse name: Not on file   Number of children: Not on file   Years of education: Not on file   Highest education level: Not on file  Occupational History   Not on file  Tobacco Use   Smoking status: Never   Smokeless tobacco: Never  Substance and Sexual Activity   Alcohol use: Never   Drug use: Never   Sexual activity: Not on file  Other Topics Concern   Not on file  Social History Narrative   Not on file   Social Determinants of Health   Financial Resource Strain: Not on file  Food Insecurity: Not on file  Transportation Needs: Not on file  Physical Activity: Not on file  Stress: Not on file  Social Connections: Not on file   No family history on file. No Known Allergies Current Outpatient Medications  Medication Sig Dispense Refill   aspirin EC 325 MG tablet Take 1 tablet (325 mg total) by mouth daily. 30 tablet 0   oxycodone (OXY-IR) 5 MG capsule Take 1 capsule (5 mg total) by mouth every 4 (four) hours as needed (severe pain). 20  capsule 0   No current facility-administered medications for this visit.   No results found.  Review of Systems:   A ROS was performed including pertinent positives and negatives as documented in the HPI.   Musculoskeletal Exam:     Incisions are well-healing with no erythema or drainage.  Left knee range of motion is from 0 to 125 degrees.  Left ankle dorsiflexion is to 25 degrees plantarflexion is 30 degrees.  Trace swelling about the ankle   Imaging:    X-rays 4 views left tibia Status post intramedullary nail left tibia without evidence of hardware complication  I personally reviewed and interpreted the radiographs.   Assessment:   45 year old female status post left tibial nail doing extremely well.  At this point I would like her to advance to weightbearing as tolerated on the left leg.  She may be out of her cam boot while at home.  I have advised that she be in the cam boot for traveling.  She may wean out of the cam boot over the next 2 weeks.  Plan :    -Return to clinic in 4 weeks   I personally saw and evaluated the patient, and participated in the management and treatment  plan.  Vanetta Mulders, MD Attending Physician, Orthopedic Surgery  This document was dictated using Dragon voice recognition software. A reasonable attempt at proof reading has been made to minimize errors.

## 2021-01-02 NOTE — Therapy (Signed)
OUTPATIENT PHYSICAL THERAPY TREATMENT NOTE   Patient Name: Janet Dalton MRN: [id number] DOB:Feb 18, 1975, 45 y.o., female Today's Date: 01/04/2021  PCP: Joycelyn Rua, MD  PT End of Session - 01/04/21 [phone]     Visit Number 2    Number of Visits 20    Date for PT Re-Evaluation 03/08/21    PT Start Time [phone]   Rx started late due to pt checking in but going back to her car to get something   PT Stop Time 0941    PT Time Calculation (min) 45 min    Equipment Utilized During Treatment Other (comment)   crutches and boot   Activity Tolerance Patient tolerated treatment well    Behavior During Therapy [facility] for tasks assessed/performed             History reviewed. No pertinent past medical history. Past Surgical History:  Procedure Laterality Date   EYE SURGERY     KNEE SURGERY Left    TIBIA IM NAIL INSERTION Left 11/30/2020   Procedure: INTRAMEDULLARY (IM) NAIL TIBIAL AND TIBIAL PLATE;  Surgeon: Huel Cote, MD;  Location: [facility] OR;  Service: Orthopedics;  Laterality: Left;   Patient Active Problem List   Diagnosis Date Noted   Left tibial fracture 11/29/2020    REFERRING PROVIDER: Huel Cote, MD   REFERRING DIAG: S82.52XD (ICD-10-CM) - Closed displaced fracture of medial malleolus of left tibia with routine healing, subsequent encounter    THERAPY DIAG:  Pain in left ankle and joints of left foot   Stiffness of left ankle, not elsewhere classified   Muscle weakness (generalized)   Difficulty in walking, not elsewhere classified   ONSET DATE:  Injury 11/29/2020 / Surgery 11/30/2020   SUBJECTIVE:    SUBJECTIVE STATEMENT: -She is limited with performing her ADLs/IADLs and normal functional mobility skills.  Pt is limited with ambulation and is using 2 crutches.  She is  limited with standing and performing household chores.  Pt has 2 kids and has difficulty taking care of kids currently.   -Pt is 5 weeks s/p left intramedullary nail of the tibia on 11/30/2020.   Pt saw MD on 12/28/2020 after PT appt.  She had x rays; see below objective for findings.  MD note indicated for pt to advance to weightbearing as tolerated on the left leg.  She may be out of her cam boot while at home and MD advised her to be in the cam boot for traveling.  She may wean out of the cam boot over the next 2 weeks. -Pt reports 2-3/10 pain currently and 3-4/10 pain yesterday.  Pt reports having numbness in dorsal aspect of foot.  Pt denies any adverse effects after prior Rx.  Pt states she has been having some pain the past couple of days especially yesterday.  Pt has been walking at home without the boot.  Pt states her foot wants to turn out.     PERTINENT HISTORY: MD notes/orders:   advance to weightbearing as tolerated on the left leg.  She may be out of her cam boot while at home and MD advised her to be in the cam boot for traveling.  She may wean out of the cam boot over the next 2 weeks.  Prior L knee surgeries x 2 (approx 7 years ago)   PAIN:  Are you having pain? Yes NRPS scale: 2-3/10 current, 1-2/10 best, 7/10 worst Pain location: anterior ankle and foot, lateral ankle, and L anterior knee Pain orientation: Left  PRECAUTIONS: Other: per surgery and WB'ing orders per MD.  See below    WEIGHT BEARING RESTRICTIONS Yes; weightbearing as tolerated on the left leg.  She may be out of her cam boot while at home and MD advised her to be in the cam boot for traveling.  She may wean out of the cam boot over the next 2 weeks.      PLOF: Independent;  Pt ambulated without an AD.  Pt was able to ambulate community distance and perform all of her normal functional mobility skills independently without limitation.  Pt was able to take care of her kids without difficulty.     PATIENT GOALS:  return to PLOF     OBJECTIVE:    DIAGNOSTIC FINDINGS:  -Pt had x rays and a CT scan.  X rays on 12/14/2020 showed ORIF distal tibial fracture and comminuted distal fibular fracture in  anatomic alignment.  -Pt had x rays on 12/28/2020 which indicated:  (Per imaging in Epic) 1. ORIF distal tibial fracture peers uncomplicated. There are minimal changes of healing. 2. Distal fibular fracture appears stable. No significant callus formation.            TODAY'S TREATMENT:             GAIT TRAINING:  Assistive device utilized: Crutches; CAM walker boot Comments: Pt ambulated with 1 crutch and CAM walker boot with cuing and instruction for proper gait sequencing and appropriate WB'ing precautions. Pre-gait training:  weight shifts s/s and f/b in boot with UE assist on table approx 15 reps          -Therapeutic Exercise: -Reviewed response to prior Rx, HEP compliance, current function, and pain levels.  -Pt received L ankle PROM in DF, PF, Eve, and Inv.  -Pt performed:   ankle pumps 3 sets of 10 reps  -Spent time working on DF AROM.   PT used verbal, visual, and tactile cuing for Tibialis anterior contraction.  toe flexion and extension AROM 2 x 10 reps seated gastroc stretch with strap 2 x 30 seconds Supine heel slides x15 reps supine SLR x 10 reps Ankle ABC's x 1 rep -Reviewed and performed HEP   -See below for pt education   PATIENT EDUCATION:  Education details: Instructed pt to bring a shoe for her L foot next visit for WB'ing activities.  Educated pt concerning anatomy and tibialis anterior contraction.  Reviewed and Updated HEP.  Answered Pt's questions.  Gave pt a HEP handout and educated pt in correct form and appropriate frequency.  Pt instructed to not perform exercises into a painful range and she should not have increased pain with HEP.  Gait training Person educated: Patient Education method: Explanation, Demonstration, Tactile cues, Verbal cues, and Handouts Education comprehension: verbalized understanding, returned demonstration, verbal cues required, and tactile cues required     HOME EXERCISE PROGRAM: Access Code: [email or web address] URL:  [email or web address] Date: 01/04/2021 Prepared by: Aaron Edelman  Exercises ANKLE PUMPS - 2-3 x daily - 7 x weekly - 2 sets - 10 reps Seated Toe Curl - 3 x daily - 7 x weekly - 2-3 sets - 10 reps Long Sitting Calf Stretch with Strap - 2 x daily - 7 x weekly - 2-3 reps - 20 seconds hold Supine Heel Slide - 2 x daily - 7 x weekly - 2 sets - 10 reps Supine Active Straight Leg Raise - 1 x daily - 6-7 x weekly - 2 sets - 10 reps Seated Ankle Alphabet -  2-3 x daily - 7 x weekly - 1 reps    ASSESSMENT:   CLINICAL IMPRESSION: Patient is 5 weeks s/p left intramedullary nail of the tibia.  Pt required cuing for correct form with HEP especially ankle pumps.   Pt has difficulty actively performing DF and compensates with performing toe extension.  Pt states she has always had difficulty with performing DF since the surgery.  PT worked on tibialis anterior firing with DF and pt is able to contract tibialis anterior.  She is limited with DF ROM.  Pt tolerated PROM well.  Pt saw MD and is now WBAT.  Pt did report some pain with s/s weight shifts though felt better with fwd/bwd weight shifts.  Pt stands with L LE toe out and also maintained that posture while performing weight shifting exercises.  Pt required cuing to correct postural position.  Pt ambulated with 1 crutch and CAM walker boot with cuing and instruction for correct sequencing.  She did not c/o of significant pain though states it was about a 2/10.  Pt responded well to Rx reporting no increased pain after Rx.  Pt should benefit from skilled PT to address impairments and goals and to assist with restoring PLOF.        Objective impairments include Abnormal gait, decreased activity tolerance, decreased endurance, decreased mobility, difficulty walking, decreased ROM, decreased strength, hypomobility, impaired flexibility, and pain. These impairments are limiting patient from cleaning, community activity, meal prep, occupation,  laundry, yard work, and shopping.         GOALS:     SHORT TERM GOALS:   STG Name Target Date Goal status  1 Pt will be independent and compliant with HEP for improved pain, ROM, strength, and function.  Baseline:  01/18/2021 INITIAL  2 Pt will demo at least 8-10 deg increase in AROM of DF, PF, and Inv for improved stiffness and mobility.   Baseline:  01/25/2021 INITIAL  3 Pt will progress with WB'ing as MD allows without adverse effects for improved gait and mobility Baseline: 01/25/2021 INITIAL  4 Pt will perform a 6 inch step up with good control and form.  Baseline: 02/22/2021 INITIAL  5 Pt will be able to perform her daily transfers without difficulty. Baseline: 02/22/2021 INITIAL                      LONG TERM GOALS:    LTG Name Target Date Goal status  1 Pt will ambulate with a normalized heel to toe gait pattern without limping.  Baseline: 03/08/2021 INITIAL  2 Pt will ambulate community distance without an AD without significant pain or difficulty.   Baseline: 03/08/2021 INITIAL  3 Pt will perform stairs with a reciprocal gait without rail with good control.  Baseline: 03/08/2021 INITIAL  4 Pt will demo at least 10 deg of L ankle DF AROM for improved stiffness and gait.  Baseline: 03/08/2021 INITIAL  5 Pt will demo 5/5 L ankle strength in DF, Eve, and Inv and WFL for PF (tested in NWB'ing) for improved tolerance to daily mobility and taking care of kids and to assist in restoring PLOF.  Baseline: 03/08/2021 INITIAL  6 Pt will be able to perform her normal standing activities including household chores without significant pain or difficulty.  Baseline: 03/08/2021 INITIAL             PLAN: PT FREQUENCY: 2x/week   PT DURATION: 10 weeks   PLANNED INTERVENTIONS: Therapeutic exercises, Therapeutic activity, Neuro  Muscular re-education, Balance training, Gait training, Patient/Family education, Joint mobilization, Stair training, Aquatic Therapy, Electrical stimulation,  Cryotherapy, Taping, and Manual therapy   PLAN FOR NEXT SESSION: Cont with ankle ROM and knee flexion PROM.  Work in active DF ROM.  Gait training and WB'ing per MD orders.  Ice as needed to reduce swelling, soreness, and pain.        Audie Clear III PT, DPT 01/04/21 5:54 PM

## 2021-01-04 ENCOUNTER — Encounter (HOSPITAL_BASED_OUTPATIENT_CLINIC_OR_DEPARTMENT_OTHER): Payer: Self-pay | Admitting: Physical Therapy

## 2021-01-04 ENCOUNTER — Other Ambulatory Visit: Payer: Self-pay

## 2021-01-04 ENCOUNTER — Ambulatory Visit (HOSPITAL_BASED_OUTPATIENT_CLINIC_OR_DEPARTMENT_OTHER): Payer: 59 | Admitting: Physical Therapy

## 2021-01-04 DIAGNOSIS — M25672 Stiffness of left ankle, not elsewhere classified: Secondary | ICD-10-CM | POA: Diagnosis present

## 2021-01-04 DIAGNOSIS — M6281 Muscle weakness (generalized): Secondary | ICD-10-CM

## 2021-01-04 DIAGNOSIS — M25572 Pain in left ankle and joints of left foot: Secondary | ICD-10-CM

## 2021-01-04 DIAGNOSIS — R262 Difficulty in walking, not elsewhere classified: Secondary | ICD-10-CM | POA: Diagnosis present

## 2021-01-04 DIAGNOSIS — S8252XD Displaced fracture of medial malleolus of left tibia, subsequent encounter for closed fracture with routine healing: Secondary | ICD-10-CM | POA: Diagnosis not present

## 2021-01-06 ENCOUNTER — Encounter (HOSPITAL_BASED_OUTPATIENT_CLINIC_OR_DEPARTMENT_OTHER): Payer: Self-pay | Admitting: Physical Therapy

## 2021-01-06 ENCOUNTER — Ambulatory Visit (HOSPITAL_BASED_OUTPATIENT_CLINIC_OR_DEPARTMENT_OTHER): Payer: 59 | Admitting: Physical Therapy

## 2021-01-06 ENCOUNTER — Other Ambulatory Visit: Payer: Self-pay

## 2021-01-06 DIAGNOSIS — M25572 Pain in left ankle and joints of left foot: Secondary | ICD-10-CM

## 2021-01-06 DIAGNOSIS — M6281 Muscle weakness (generalized): Secondary | ICD-10-CM

## 2021-01-06 DIAGNOSIS — R262 Difficulty in walking, not elsewhere classified: Secondary | ICD-10-CM

## 2021-01-06 DIAGNOSIS — M25672 Stiffness of left ankle, not elsewhere classified: Secondary | ICD-10-CM

## 2021-01-06 NOTE — Therapy (Signed)
OUTPATIENT PHYSICAL THERAPY TREATMENT NOTE   Patient Name: Janet Dalton MRN: [id number] DOB:11/21/1975, 45 y.o., female Today's Date: 01/06/2021  PCP: Joycelyn Rua, MD  PT End of Session - 01/06/21 1023     Visit Number 3    Number of Visits 20    Date for PT Re-Evaluation 03/08/21    Progress Note Due on Visit --   01/28/2021   PT Start Time 1022    PT Stop Time 1107    PT Time Calculation (min) 45 min    Equipment Utilized During Treatment Other (comment)   crutches and boot   Activity Tolerance Patient tolerated treatment well    Behavior During Therapy [facility] for tasks assessed/performed             History reviewed. No pertinent past medical history. Past Surgical History:  Procedure Laterality Date   EYE SURGERY     KNEE SURGERY Left    TIBIA IM NAIL INSERTION Left 11/30/2020   Procedure: INTRAMEDULLARY (IM) NAIL TIBIAL AND TIBIAL PLATE;  Surgeon: Huel Cote, MD;  Location: [facility] OR;  Service: Orthopedics;  Laterality: Left;   Patient Active Problem List   Diagnosis Date Noted   Left tibial fracture 11/29/2020    REFERRING PROVIDER: Huel Cote, MD   REFERRING DIAG: S82.52XD (ICD-10-CM) - Closed displaced fracture of medial malleolus of left tibia with routine healing, subsequent encounter    THERAPY DIAG:  Pain in left ankle and joints of left foot   Stiffness of left ankle, not elsewhere classified   Muscle weakness (generalized)   Difficulty in walking, not elsewhere classified   ONSET DATE:  Injury 11/29/2020 / Surgery 11/30/2020   SUBJECTIVE:    SUBJECTIVE STATEMENT: -She is limited with performing her ADLs/IADLs and normal functional mobility skills.  Pt is limited with ambulation and is using 2 crutches.  She is  limited with standing and performing household chores.  Pt has 2 kids and has difficulty taking care of kids currently.   -Pt is 5 weeks and 2 days s/p left intramedullary nail of the tibia on 11/30/2020.  Pt saw MD on 12/28/2020  after PT appt.  She had x rays; see below objective for findings.  MD note indicated for pt to advance to weightbearing as tolerated on the left leg.  She may be out of her cam boot while at home and MD advised her to be in the cam boot for traveling.  She may wean out of the cam boot over the next 2 weeks. -Pt reports having numbness in dorsal aspect of foot.  Pt stated she was having some pain prior to last appt and she feels about the same.  Pt has been walking at home without the boot with 1 crutch without adverse effects.  Pt states her foot wants to turn out.    -Pt reports no pain at rest and has 3-4/10 pain with ambulating fast.  Pt denies any adverse effects after prior Rx.  Pt reports compliance with HEP.   PERTINENT HISTORY: MD notes/orders:   advance to weightbearing as tolerated on the left leg.  She may be out of her cam boot while at home and MD advised her to be in the cam boot for traveling.  She may wean out of the cam boot over the next 2 weeks.  Prior L knee surgeries x 2 (approx 7 years ago)   PAIN:  Are you having pain? Yes NRPS scale: 0/10 currently at rest, 1-2/10 best, 7/10 worst  Pain location: anterior ankle and foot, lateral ankle, and L anterior knee Pain orientation: Left      PRECAUTIONS: Other: per surgery and WB'ing orders per MD.  See below    WEIGHT BEARING RESTRICTIONS Yes; weightbearing as tolerated on the left leg.  She may be out of her cam boot while at home and MD advised her to be in the cam boot for traveling.  She may wean out of the cam boot over the next 2 weeks.     PLOF: Independent;  Pt ambulated without an AD.  Pt was able to ambulate community distance and perform all of her normal functional mobility skills independently without limitation.  Pt was able to take care of her kids without difficulty.     PATIENT GOALS:  return to PLOF     OBJECTIVE:    DIAGNOSTIC FINDINGS:  -Pt had x rays and a CT scan.  X rays on 12/14/2020 showed ORIF  distal tibial fracture and comminuted distal fibular fracture in anatomic alignment.  -Pt had x rays on 12/28/2020 which indicated:  (Per imaging in Epic) 1. ORIF distal tibial fracture peers uncomplicated. There are minimal changes of healing. 2. Distal fibular fracture appears stable. No significant callus formation.            TODAY'S TREATMENT:             GAIT TRAINING:  Assistive device utilized: Crutches;  Comments: Pt brought in tennis shoe.  She ambulated in tennis shoes with bilat crutches with cuing and instruction for proper gait sequencing, heel strike, and WBAT precautions.  Pt reports 2/10 pain with gait.        -Therapeutic Exercise: -Reviewed response to prior Rx, HEP compliance, current function, and pain levels.  -Pt received L ankle PROM in DF, PF, Eve, and Inv.  -Pt received gastroc and soleus stretches 2x30 seconds each -Pt performed:   ankle pumps 2 sets of 10 reps S/L hip abd 2x10 reps  Ankle ABC's x 1 rep Ankle circles 2x10 reps cw and ccw Seated DF/PF on fitter first 2x10 reps  -Updated HEP and gave pt a HEP handout.    -See below for pt education   PATIENT EDUCATION:  Education details:  Answered Pt's questions.  MD orders.  Educated pt concerning anatomy and tibialis anterior contraction.  Updated HEP. Gave pt a HEP handout and educated pt in correct form and appropriate frequency.  Pt instructed to not perform exercises into a painful range and she should not have increased pain with HEP.  Gait training Person educated: Patient Education method: Explanation, Demonstration, Tactile cues, Verbal cues, and Handouts Education comprehension: verbalized understanding, returned demonstration, verbal cues required, and tactile cues required     HOME EXERCISE PROGRAM: Access Code: [email or web address] URL: [email or web address] Date: 01/06/2021 Prepared by: Aaron Edelman  Exercises ANKLE PUMPS - 2-3 x daily - 7 x weekly - 2 sets - 10 reps Seated  Toe Curl - 3 x daily - 7 x weekly - 2-3 sets - 10 reps Long Sitting Calf Stretch with Strap - 2 x daily - 7 x weekly - 2-3 reps - 20 seconds hold Supine Heel Slide - 2 x daily - 7 x weekly - 2 sets - 10 reps Supine Active Straight Leg Raise - 1 x daily - 6-7 x weekly - 2 sets - 10 reps Seated Ankle Alphabet - 2-3 x daily - 7 x weekly - 1 reps Seated Ankle Circles - 2 x daily - 7  x weekly - 2 sets - 10 reps Sidelying Hip Abduction - 1 x daily - 5-7 x weekly - 2 sets - 10 reps     ASSESSMENT:   CLINICAL IMPRESSION: Patient is 5 weeks and 2 days s/p left intramedullary nail of the tibia.  Pt is limited with DF ROM though demonstrates improved form and ability to perform DF.  She still uses toes some to help with DF but is able to perform DF at ankle today.  Pt is limited and tight with ankle PROM.  Pt performed exercises well with cuing for correct form.   PT instructed pt with gait in tennis shoe with bilat crutches.  She ambulated well with bilat  crutches without boot with cuing and instruction for correct sequencing and heel strike.  She did not c/o of significant pain though states it was about a 2/10.  Pt responded well to Rx having no c/o's after Rx.  Pt should benefit from continued skilled PT to address impairments and goals and to assist with restoring PLOF.        Objective impairments include Abnormal gait, decreased activity tolerance, decreased endurance, decreased mobility, difficulty walking, decreased ROM, decreased strength, hypomobility, impaired flexibility, and pain. These impairments are limiting patient from cleaning, community activity, meal prep, occupation, laundry, yard work, and shopping.         GOALS:     SHORT TERM GOALS:   STG Name Target Date Goal status  1 Pt will be independent and compliant with HEP for improved pain, ROM, strength, and function.  Baseline:  01/18/2021 INITIAL  2 Pt will demo at least 8-10 deg increase in AROM of DF, PF, and Inv for  improved stiffness and mobility.   Baseline:  01/25/2021 INITIAL  3 Pt will progress with WB'ing as MD allows without adverse effects for improved gait and mobility Baseline: 01/25/2021 INITIAL  4 Pt will perform a 6 inch step up with good control and form.  Baseline: 02/22/2021 INITIAL  5 Pt will be able to perform her daily transfers without difficulty. Baseline: 02/22/2021 INITIAL                      LONG TERM GOALS:    LTG Name Target Date Goal status  1 Pt will ambulate with a normalized heel to toe gait pattern without limping.  Baseline: 03/08/2021 INITIAL  2 Pt will ambulate community distance without an AD without significant pain or difficulty.   Baseline: 03/08/2021 INITIAL  3 Pt will perform stairs with a reciprocal gait without rail with good control.  Baseline: 03/08/2021 INITIAL  4 Pt will demo at least 10 deg of L ankle DF AROM for improved stiffness and gait.  Baseline: 03/08/2021 INITIAL  5 Pt will demo 5/5 L ankle strength in DF, Eve, and Inv and WFL for PF (tested in NWB'ing) for improved tolerance to daily mobility and taking care of kids and to assist in restoring PLOF.  Baseline: 03/08/2021 INITIAL  6 Pt will be able to perform her normal standing activities including household chores without significant pain or difficulty.  Baseline: 03/08/2021 INITIAL             PLAN: PT FREQUENCY: 2x/week   PT DURATION: 10 weeks   PLANNED INTERVENTIONS: Therapeutic exercises, Therapeutic activity, Neuro Muscular re-education, Balance training, Gait training, Patient/Family education, Joint mobilization, Stair training, Aquatic Therapy, Electrical stimulation, Cryotherapy, Taping, and Manual therapy   PLAN FOR NEXT SESSION: Cont with ankle ROM  and knee flexion PROM.  Work in active DF ROM.  Gait training and WB'ing per MD orders.  Ice as needed to reduce swelling, soreness, and pain.        Audie Clear III PT, DPT 01/06/21 12:22 PM

## 2021-01-13 ENCOUNTER — Other Ambulatory Visit: Payer: Self-pay

## 2021-01-13 ENCOUNTER — Encounter (HOSPITAL_BASED_OUTPATIENT_CLINIC_OR_DEPARTMENT_OTHER): Payer: Self-pay | Admitting: Physical Therapy

## 2021-01-13 ENCOUNTER — Ambulatory Visit (HOSPITAL_BASED_OUTPATIENT_CLINIC_OR_DEPARTMENT_OTHER): Payer: 59 | Admitting: Physical Therapy

## 2021-01-13 DIAGNOSIS — M6281 Muscle weakness (generalized): Secondary | ICD-10-CM

## 2021-01-13 DIAGNOSIS — M25572 Pain in left ankle and joints of left foot: Secondary | ICD-10-CM | POA: Diagnosis not present

## 2021-01-13 DIAGNOSIS — R262 Difficulty in walking, not elsewhere classified: Secondary | ICD-10-CM

## 2021-01-13 DIAGNOSIS — M25672 Stiffness of left ankle, not elsewhere classified: Secondary | ICD-10-CM

## 2021-01-13 NOTE — Therapy (Signed)
OUTPATIENT PHYSICAL THERAPY TREATMENT NOTE   Patient Name: Janet Dalton MRN: [id number] DOB:08-Jan-1976, 45 y.o., female Today's Date: 01/13/2021  PCP: Joycelyn Rua, MD  PT End of Session - 01/13/21 1157     Visit Number 4    Number of Visits 20    Date for PT Re-Evaluation 03/08/21    Progress Note Due on Visit --   01/28/21   PT Start Time 1153    PT Stop Time 1236    PT Time Calculation (min) 43 min    Equipment Utilized During Treatment Other (comment)   1 crutch   Activity Tolerance Patient tolerated treatment well    Behavior During Therapy [facility] for tasks assessed/performed             History reviewed. No pertinent past medical history. Past Surgical History:  Procedure Laterality Date   EYE SURGERY     KNEE SURGERY Left    TIBIA IM NAIL INSERTION Left 11/30/2020   Procedure: INTRAMEDULLARY (IM) NAIL TIBIAL AND TIBIAL PLATE;  Surgeon: Huel Cote, MD;  Location: [facility] OR;  Service: Orthopedics;  Laterality: Left;   Patient Active Problem List   Diagnosis Date Noted   Left tibial fracture 11/29/2020    REFERRING PROVIDER: Huel Cote, MD   REFERRING DIAG: S82.52XD (ICD-10-CM) - Closed displaced fracture of medial malleolus of left tibia with routine healing, subsequent encounter    THERAPY DIAG:  Pain in left ankle and joints of left foot   Stiffness of left ankle, not elsewhere classified   Muscle weakness (generalized)   Difficulty in walking, not elsewhere classified   ONSET DATE:  Injury 11/29/2020 / Surgery 11/30/2020   SUBJECTIVE:    SUBJECTIVE STATEMENT: -She is limited with performing her ADLs/IADLs and normal functional mobility skills.  Pt is limited with ambulation, standing activities, and performing household chores.  Pt has 2 kids and has difficulty taking care of kids currently.   -Pt is 6 weeks and 2 days s/p left intramedullary nail of the tibia on 11/30/2020.  Pt saw MD on 12/28/2020 after PT appt.  She had x rays; see below  objective for findings.  MD note indicated for pt to advance to weightbearing as tolerated on the left leg.  She may be out of her cam boot while at home and MD advised her to be in the cam boot for traveling.  She may wean out of the cam boot over the next 2 weeks. -Pt reports having numbness in dorsal aspect of foot.  Pt has been walking at home without the boot with 1 crutch without adverse effects.  Pt states her foot wants to turn out.   -Pt states she has pain currently which may be due to walking fast to get here.  Pt denies any adverse effects after prior Rx.  Pt reports compliance with HEP.   PERTINENT HISTORY: MD notes/orders:   advance to weightbearing as tolerated on the left leg.  She may be out of her cam boot while at home and MD advised her to be in the cam boot for traveling.  She may wean out of the cam boot over the next 2 weeks.  Prior L knee surgeries x 2 (approx 7 years ago)   PAIN:  Are you having pain? Yes NRPS scale: 2-3/10 currently at rest, 1-2/10 best, 7/10 worst Pain location: anterior ankle and foot, lateral ankle, and L anterior knee Pain orientation: Left      PRECAUTIONS: Other: per surgery and WB'ing orders  per MD.  See below    WEIGHT BEARING RESTRICTIONS Yes; weightbearing as tolerated on the left leg.  She may be out of her cam boot while at home and MD advised her to be in the cam boot for traveling.  She may wean out of the cam boot over the next 2 weeks.     PLOF: Independent;  Pt ambulated without an AD.  Pt was able to ambulate community distance and perform all of her normal functional mobility skills independently without limitation.  Pt was able to take care of her kids without difficulty.     PATIENT GOALS:  return to PLOF     OBJECTIVE:    DIAGNOSTIC FINDINGS:  -Pt had x rays and a CT scan.  X rays on 12/14/2020 showed ORIF distal tibial fracture and comminuted distal fibular fracture in anatomic alignment.  -Pt had x rays on 12/28/2020  which indicated:  (Per imaging in Epic) 1. ORIF distal tibial fracture peers uncomplicated. There are minimal changes of healing. 2. Distal fibular fracture appears stable. No significant callus formation.            TODAY'S TREATMENT:             GAIT TRAINING:  Assistive device utilized: Crutches;  Comments: Pt brought in tennis shoe.  She ambulated in tennis shoes with 1 crutches with cuing and instruction for proper gait sequencing, heel strike, heel to toe gait, and WBAT precautions.  Standing Weight shifts (in shoe) f/b and s/s with bilat UE assist 2x10 reps       -Therapeutic Exercise: -Reviewed response to prior Rx, HEP compliance, current function, and pain levels.  -Pt received L ankle PROM in DF, PF, Eve, and Inv.  -Pt received gastroc and soleus stretches 2x30 seconds each -Pt performed:   ankle pumps in longsitting, seated at EOT with knee flexed, and in S/L'ing 2 sets of 10 reps  Ankle ABC's x 1 rep Ankle circles 2x10 reps cw and ccw Seated DF/PF and cw and ccw circles on fitter first 2x10 reps each -Reviewed HEP    -See below for pt education   PATIENT EDUCATION:  Education details:  Answered Pt's questions.  MD orders.  Educated pt concerning anatomy and tibialis anterior contraction.  Reviewed HEP.  Gait training Person educated: Patient Education method: Explanation, Demonstration, Tactile cues, Verbal cues Education comprehension: verbalized understanding, returned demonstration, verbal cues required, and tactile cues required     HOME EXERCISE PROGRAM: Access Code: [email or web address] URL: [email or web address] Date: 01/06/2021 Prepared by: Aaron Edelman  Exercises ANKLE PUMPS - 2-3 x daily - 7 x weekly - 2 sets - 10 reps Seated Toe Curl - 3 x daily - 7 x weekly - 2-3 sets - 10 reps Long Sitting Calf Stretch with Strap - 2 x daily - 7 x weekly - 2-3 reps - 20 seconds hold Supine Heel Slide - 2 x daily - 7 x weekly - 2 sets - 10 reps Supine Active  Straight Leg Raise - 1 x daily - 6-7 x weekly - 2 sets - 10 reps Seated Ankle Alphabet - 2-3 x daily - 7 x weekly - 1 reps Seated Ankle Circles - 2 x daily - 7 x weekly - 2 sets - 10 reps Sidelying Hip Abduction - 1 x daily - 5-7 x weekly - 2 sets - 10 reps     ASSESSMENT:   CLINICAL IMPRESSION: Patient is 6 weeks and 2 days s/p left intramedullary nail of the  tibia.  Pt entered clinic without boot and wearing tennis shoe.  She stands and ambulates with R LE turned out.  Gait training preformed with 1 crutch.  Pt favors R LE with gait and has decreased stance time and WB'ing in R LE.  She is slow with gait.  Pt demonstrates improved gait with cuing and instruction and did not c/o of increased pain.  Pt has difficulty and limitations with performing active DF.  She continues to compensate with toe extension.  Performed ankle pumps in different positions including with gravity minimized in order to achieve easier and greater DF AROM.  Pt demonstrates improved form and ability to perform DF with cuing and concentration.  Pt is tight and limited with ankle ROM.  Pt is progressing with gait and responded well to Rx having no increased pain after Rx.  Pt should benefit from continued skilled PT to address impairments and goals and to assist with restoring PLOF.      Objective impairments include Abnormal gait, decreased activity tolerance, decreased endurance, decreased mobility, difficulty walking, decreased ROM, decreased strength, hypomobility, impaired flexibility, and pain. These impairments are limiting patient from cleaning, community activity, meal prep, occupation, laundry, yard work, and shopping.         GOALS:     SHORT TERM GOALS:   STG Name Target Date Goal status  1 Pt will be independent and compliant with HEP for improved pain, ROM, strength, and function.  Baseline:  01/18/2021 INITIAL  2 Pt will demo at least 8-10 deg increase in AROM of DF, PF, and Inv for improved stiffness  and mobility.   Baseline:  01/25/2021 INITIAL  3 Pt will progress with WB'ing as MD allows without adverse effects for improved gait and mobility Baseline: 01/25/2021 INITIAL  4 Pt will perform a 6 inch step up with good control and form.  Baseline: 02/22/2021 INITIAL  5 Pt will be able to perform her daily transfers without difficulty. Baseline: 02/22/2021 INITIAL                      LONG TERM GOALS:    LTG Name Target Date Goal status  1 Pt will ambulate with a normalized heel to toe gait pattern without limping.  Baseline: 03/08/2021 INITIAL  2 Pt will ambulate community distance without an AD without significant pain or difficulty.   Baseline: 03/08/2021 INITIAL  3 Pt will perform stairs with a reciprocal gait without rail with good control.  Baseline: 03/08/2021 INITIAL  4 Pt will demo at least 10 deg of L ankle DF AROM for improved stiffness and gait.  Baseline: 03/08/2021 INITIAL  5 Pt will demo 5/5 L ankle strength in DF, Eve, and Inv and WFL for PF (tested in NWB'ing) for improved tolerance to daily mobility and taking care of kids and to assist in restoring PLOF.  Baseline: 03/08/2021 INITIAL  6 Pt will be able to perform her normal standing activities including household chores without significant pain or difficulty.  Baseline: 03/08/2021 INITIAL             PLAN: PT FREQUENCY: 2x/week   PT DURATION: 10 weeks   PLANNED INTERVENTIONS: Therapeutic exercises, Therapeutic activity, Neuro Muscular re-education, Balance training, Gait training, Patient/Family education, Joint mobilization, Stair training, Aquatic Therapy, Electrical stimulation, Cryotherapy, Taping, and Manual therapy   PLAN FOR NEXT SESSION: Cont with ankle ROM and knee flexion PROM.  Work on active DF ROM.  Gait training and WB'ing per MD orders.  Ice as needed to reduce swelling, soreness, and pain.        Audie Clear III PT, DPT 01/13/21 10:31 PM

## 2021-01-15 ENCOUNTER — Ambulatory Visit (HOSPITAL_BASED_OUTPATIENT_CLINIC_OR_DEPARTMENT_OTHER): Payer: 59 | Attending: Orthopaedic Surgery | Admitting: Physical Therapy

## 2021-01-15 ENCOUNTER — Other Ambulatory Visit: Payer: Self-pay

## 2021-01-15 ENCOUNTER — Encounter (HOSPITAL_BASED_OUTPATIENT_CLINIC_OR_DEPARTMENT_OTHER): Payer: Self-pay | Admitting: Physical Therapy

## 2021-01-15 DIAGNOSIS — R262 Difficulty in walking, not elsewhere classified: Secondary | ICD-10-CM | POA: Diagnosis present

## 2021-01-15 DIAGNOSIS — M6281 Muscle weakness (generalized): Secondary | ICD-10-CM | POA: Diagnosis present

## 2021-01-15 DIAGNOSIS — M25672 Stiffness of left ankle, not elsewhere classified: Secondary | ICD-10-CM | POA: Diagnosis present

## 2021-01-15 DIAGNOSIS — M25572 Pain in left ankle and joints of left foot: Secondary | ICD-10-CM

## 2021-01-15 NOTE — Therapy (Signed)
OUTPATIENT PHYSICAL THERAPY TREATMENT NOTE   Patient Name: Janet Dalton MRN: [id number] DOB:02/22/1975, 45 y.o., female Today's Date: 01/15/2021  PCP: Joycelyn Rua, MD  PT End of Session - 01/15/21 1026     Visit Number 5    Number of Visits 20    Date for PT Re-Evaluation 03/08/21    Authorization Type [employer]    PT Start Time 1023    PT Stop Time 1105    PT Time Calculation (min) 42 min    Equipment Utilized During Treatment Other (comment)   1 crutch   Activity Tolerance Patient tolerated treatment well    Behavior During Therapy WFL for tasks assessed/performed             History reviewed. No pertinent past medical history. Past Surgical History:  Procedure Laterality Date   EYE SURGERY     KNEE SURGERY Left    TIBIA IM NAIL INSERTION Left 11/30/2020   Procedure: INTRAMEDULLARY (IM) NAIL TIBIAL AND TIBIAL PLATE;  Surgeon: Huel Cote, MD;  Location: [facility] OR;  Service: Orthopedics;  Laterality: Left;   Patient Active Problem List   Diagnosis Date Noted   Left tibial fracture 11/29/2020    REFERRING PROVIDER: Huel Cote, MD   REFERRING DIAG: S82.52XD (ICD-10-CM) - Closed displaced fracture of medial malleolus of left tibia with routine healing, subsequent encounter    THERAPY DIAG:  Pain in left ankle and joints of left foot   Stiffness of left ankle, not elsewhere classified   Muscle weakness (generalized)   Difficulty in walking, not elsewhere classified   ONSET DATE:  Injury 11/29/2020 / Surgery 11/30/2020   SUBJECTIVE:    SUBJECTIVE STATEMENT: -She is limited with performing her ADLs/IADLs and normal functional mobility skills.  Pt is limited with ambulation, standing activities, and performing household chores.  Pt has 2 kids and has difficulty taking care of kids currently.   -Pt is 6 weeks and 4 days s/p left intramedullary nail of the tibia on 11/30/2020.  Pt saw MD on 12/28/2020 after PT appt.  She had x rays; see below objective for  findings.  MD note indicated for pt to advance to weightbearing as tolerated on the left leg.  She may be out of her cam boot while at home and MD advised her to be in the cam boot for traveling.  She may wean out of the cam boot over the next 2 weeks.  -Pt denies any adverse effects after prior Rx.  Pt reports compliance with HEP.  Pt reports she is having medial foot pain currently.  Pt continues to report numbness on dorsum of foot and is concerned about numbness.  Her lateral foot numbness has improved though she still has it centrally and medially.  She also reports having these dark spots on the dorsum of her foot that were not there prior to surgery.   PERTINENT HISTORY: MD notes/orders:   advance to weightbearing as tolerated on the left leg.  She may be out of her cam boot while at home and MD advised her to be in the cam boot for traveling.  She may wean out of the cam boot over the next 2 weeks.  Prior L knee surgeries x 2 (approx 7 years ago)   PAIN:  Are you having pain? Yes NRPS scale: 2-3/10 currently at rest, 0/10 best, 4/10 worst Pain location: anterior ankle and foot, lateral ankle, and L anterior knee Pain orientation: Left      PRECAUTIONS: Other: per  surgery and WB'ing orders per MD.  See below    WEIGHT BEARING RESTRICTIONS Yes; weightbearing as tolerated on the left leg.  She may be out of her cam boot while at home and MD advised her to be in the cam boot for traveling.  She may wean out of the cam boot over the next 2 weeks.     PLOF: Independent;  Pt ambulated without an AD.  Pt was able to ambulate community distance and perform all of her normal functional mobility skills independently without limitation.  Pt was able to take care of her kids without difficulty.     PATIENT GOALS:  return to PLOF     OBJECTIVE:    DIAGNOSTIC FINDINGS:  -Pt had x rays and a CT scan.  X rays on 12/14/2020 showed ORIF distal tibial fracture and comminuted distal fibular fracture  in anatomic alignment.  -Pt had x rays on 12/28/2020 which indicated:  (Per imaging in Epic) 1. ORIF distal tibial fracture peers uncomplicated. There are minimal changes of healing. 2. Distal fibular fracture appears stable. No significant callus formation.            TODAY'S TREATMENT:           OBSERVATION:  Pt has 5 small darker spots on medial to central dorsum of foot that has been present.  Pt states she is not sure what they are from and have been there since surgery.  The spots are not near the incisions.  PT instructed to consult MD about those spots.  Pt has dry skin on L foot.    SENSATION: Pt has sensation to LT in medial L foot.  1+ on L and 2+ on R.   GAIT TRAINING:  Assistive device utilized: Crutch  Comments: Pt entered facility in tennis shoe.   -She ambulated in tennis shoes with 1 crutch with cuing and instruction for proper gait sequencing, heel strike, heel to toe gait, and WBAT precautions.  -Performed Mirror walking with cuing for increased Wb'ing thru L LE and L LE positioning.  L ankle AROM/PROM:   DF: initial:  -12/-2, current:  -8/3 deg       -Therapeutic Exercise: -Reviewed response to prior Rx, HEP compliance, current function, and pain levels.  -Assessed ROM. -Pt received L ankle PROM in DF, PF, Eve, and Inv.  -Pt received gastroc and soleus stretches 2x30 seconds each -Pt performed:   ankle pumps in longsitting and in S/L'ing 2 sets of 10 reps  Ankle ABC's x 1 rep Ankle circles 2x10 reps cw and ccw Seated DF/PF and cw and ccw circles on fitter first 2x10 reps each Longsitting gastroc stretch 3x30 sec Ankle eve/inv with towel seated 2x10 reps -Reviewed HEP    -See below for pt education   PATIENT EDUCATION:  Education details:  Answered Pt's questions.  Educated pt concerning post op expectations.  Instructed pt to contact MD about the spots on her foot and also the numbness if it's not improving.  MD orders.  Educated pt concerning anatomy  and tibialis anterior contraction.  Reviewed HEP.  Gait training Person educated: Patient Education method: Explanation, Demonstration, Tactile cues, Verbal cues Education comprehension: verbalized understanding, returned demonstration, verbal cues required, and tactile cues required     HOME EXERCISE PROGRAM: Access Code: [email or web address] URL: [email or web address] Date: 01/06/2021 Prepared by: Aaron Edelman  Exercises ANKLE PUMPS - 2-3 x daily - 7 x weekly - 2 sets - 10 reps Seated Toe Curl - 3 x  daily - 7 x weekly - 2-3 sets - 10 reps Long Sitting Calf Stretch with Strap - 2 x daily - 7 x weekly - 2-3 reps - 20 seconds hold Supine Heel Slide - 2 x daily - 7 x weekly - 2 sets - 10 reps Supine Active Straight Leg Raise - 1 x daily - 6-7 x weekly - 2 sets - 10 reps Seated Ankle Alphabet - 2-3 x daily - 7 x weekly - 1 reps Seated Ankle Circles - 2 x daily - 7 x weekly - 2 sets - 10 reps Sidelying Hip Abduction - 1 x daily - 5-7 x weekly - 2 sets - 10 reps     ASSESSMENT:   CLINICAL IMPRESSION: Patient is 6 weeks and 4 days s/p left intramedullary nail of the tibia.  Pt entered clinic wearing tennis shoe.  She continues to stand and ambulate with R LE turned out though is able to quickly improve.  Gait training preformed with 1 crutch.  Pt has increased WB'ing thru crutch though improved with cuing and instruction.  Pt demonstrates improved heel to toe pattern.  She reports 2/10 pain with gait with 1 crutch.  Pt has stiffness and limitations with ankle ROM.  She has difficulty and limitations with performing active DF.  She continues to compensate with toe extension.  Performed ankle pumps in different positions including with gravity minimized in order to achieve easier and greater DF AROM.  Pt is improving slowly with ankle DF AROM and PROM as evidenced by objective findings and has improved performance of DF with cuing and concentration.  Pt is progressing with gait and responded  well to Rx having no increased pain after Rx.  Pt should benefit from continued skilled PT to address impairments and goals and to assist with restoring PLOF.      Objective impairments include Abnormal gait, decreased activity tolerance, decreased endurance, decreased mobility, difficulty walking, decreased ROM, decreased strength, hypomobility, impaired flexibility, and pain. These impairments are limiting patient from cleaning, community activity, meal prep, occupation, laundry, yard work, and shopping.         GOALS:     SHORT TERM GOALS:   STG Name Target Date Goal status  1 Pt will be independent and compliant with HEP for improved pain, ROM, strength, and function.  Baseline:  01/18/2021 INITIAL  2 Pt will demo at least 8-10 deg increase in AROM of DF, PF, and Inv for improved stiffness and mobility.   Baseline:  01/25/2021 INITIAL  3 Pt will progress with WB'ing as MD allows without adverse effects for improved gait and mobility Baseline: 01/25/2021 INITIAL  4 Pt will perform a 6 inch step up with good control and form.  Baseline: 02/22/2021 INITIAL  5 Pt will be able to perform her daily transfers without difficulty. Baseline: 02/22/2021 INITIAL                      LONG TERM GOALS:    LTG Name Target Date Goal status  1 Pt will ambulate with a normalized heel to toe gait pattern without limping.  Baseline: 03/08/2021 INITIAL  2 Pt will ambulate community distance without an AD without significant pain or difficulty.   Baseline: 03/08/2021 INITIAL  3 Pt will perform stairs with a reciprocal gait without rail with good control.  Baseline: 03/08/2021 INITIAL  4 Pt will demo at least 10 deg of L ankle DF AROM for improved stiffness and gait.  Baseline: 03/08/2021  INITIAL  5 Pt will demo 5/5 L ankle strength in DF, Eve, and Inv and WFL for PF (tested in NWB'ing) for improved tolerance to daily mobility and taking care of kids and to assist in restoring PLOF.  Baseline: 03/08/2021  INITIAL  6 Pt will be able to perform her normal standing activities including household chores without significant pain or difficulty.  Baseline: 03/08/2021 INITIAL             PLAN:   PLANNED INTERVENTIONS: Therapeutic exercises, Therapeutic activity, Neuro Muscular re-education, Balance training, Gait training, Patient/Family education, Joint mobilization, Stair training, Aquatic Therapy, Electrical stimulation, Cryotherapy, Taping, and Manual therapy   PLAN FOR NEXT SESSION: Cont with ankle ROM and knee flexion PROM.  Work on active DF ROM.  Gait training and WB'ing per MD orders.  Ice as needed to reduce swelling, soreness, and pain.        Audie Clear III PT, DPT 01/15/21 2:02 PM

## 2021-01-20 ENCOUNTER — Encounter (HOSPITAL_BASED_OUTPATIENT_CLINIC_OR_DEPARTMENT_OTHER): Payer: Self-pay | Admitting: Physical Therapy

## 2021-01-20 ENCOUNTER — Ambulatory Visit (HOSPITAL_BASED_OUTPATIENT_CLINIC_OR_DEPARTMENT_OTHER): Payer: 59 | Admitting: Physical Therapy

## 2021-01-20 ENCOUNTER — Other Ambulatory Visit: Payer: Self-pay

## 2021-01-20 DIAGNOSIS — M25572 Pain in left ankle and joints of left foot: Secondary | ICD-10-CM

## 2021-01-20 DIAGNOSIS — M25672 Stiffness of left ankle, not elsewhere classified: Secondary | ICD-10-CM

## 2021-01-20 DIAGNOSIS — M6281 Muscle weakness (generalized): Secondary | ICD-10-CM

## 2021-01-20 DIAGNOSIS — R262 Difficulty in walking, not elsewhere classified: Secondary | ICD-10-CM

## 2021-01-20 NOTE — Therapy (Signed)
OUTPATIENT PHYSICAL THERAPY TREATMENT NOTE   Patient Name: Dontasia Miranda MRN: [id number] DOB:[birth year], 45 y.o., female Today's Date: 01/20/2021  PCP: Joycelyn Rua, MD  PT End of Session - 01/20/21 0857     Visit Number 6    Number of Visits 20    Date for PT Re-Evaluation 03/08/21    Authorization Type [employer]    PT Start Time 0854    PT Stop Time 0939    PT Time Calculation (min) 45 min    Activity Tolerance Patient tolerated treatment well    Behavior During Therapy [facility] for tasks assessed/performed             History reviewed. No pertinent past medical history. Past Surgical History:  Procedure Laterality Date   EYE SURGERY     KNEE SURGERY Left    TIBIA IM NAIL INSERTION Left 11/30/2020   Procedure: INTRAMEDULLARY (IM) NAIL TIBIAL AND TIBIAL PLATE;  Surgeon: Huel Cote, MD;  Location: [facility] OR;  Service: Orthopedics;  Laterality: Left;   Patient Active Problem List   Diagnosis Date Noted   Left tibial fracture 11/29/2020    REFERRING PROVIDER: Huel Cote, MD   REFERRING DIAG: S82.52XD (ICD-10-CM) - Closed displaced fracture of medial malleolus of left tibia with routine healing, subsequent encounter    THERAPY DIAG:  Pain in left ankle and joints of left foot   Stiffness of left ankle, not elsewhere classified   Muscle weakness (generalized)   Difficulty in walking, not elsewhere classified   ONSET DATE:  Injury 11/29/2020 / Surgery 11/30/2020   SUBJECTIVE:    SUBJECTIVE STATEMENT: -She is limited with performing her ADLs/IADLs and normal functional mobility skills.  Pt is limited with ambulation, standing activities, and performing household chores.  Pt has 2 kids and has difficulty taking care of kids currently.   -Pt reports compliance with HEP.  Pt continues to report numbness on dorsum of foot and is concerned about the coloring of her foot. -Pt states she had a little pain after prior Rx though it didn't last long.  Pt had increased pain  yesterday which she contributes to standing awhile.  Pt states she had some post ankle/achilles pain.  Pt states she peeled some of the dry skin off her dorsal foot where the spots were and they look better now.  She states the spots are still there but less now.   PERTINENT HISTORY: MD notes/orders:   advance to weightbearing as tolerated on the left leg.  Prior L knee surgeries x 2 (approx 7 years ago)   PAIN:  Are you having pain? Yes NRPS scale: 2-3/10 currently at rest, 0/10 best, 4/10 worst Pain location: anterior ankle and foot, lateral ankle, and L anterior knee Pain orientation: Left      PRECAUTIONS: Other: per surgery and WB'ing orders per MD.  See below    WEIGHT BEARING RESTRICTIONS Yes; weightbearing as tolerated on the left leg.    PLOF: Independent;  Pt ambulated without an AD.  Pt was able to ambulate community distance and perform all of her normal functional mobility skills independently without limitation.  Pt was able to take care of her kids without difficulty.     PATIENT GOALS:  return to PLOF     OBJECTIVE:    DIAGNOSTIC FINDINGS:  -Pt had x rays and a CT scan.  X rays on 12/14/2020 showed ORIF distal tibial fracture and comminuted distal fibular fracture in anatomic alignment.  -Pt had x rays on 12/28/2020 which  indicated:  (Per imaging in Epic) 1. ORIF distal tibial fracture peers uncomplicated. There are minimal changes of healing. 2. Distal fibular fracture appears stable. No significant callus formation.            TODAY'S TREATMENT:           OBSERVATION:  The dark spots on her medial to central dorsum of foot are significantly less now after pt has peeled dry skin away.  PT instructed to consult MD about those spots.  Pt has dry skin on L foot though much less compared to prior Rx    SENSATION: Pt has sensation to LT in medial L foot.  1+ on L and 2+ on R.   GAIT TRAINING:  Assistive device utilized: Crutch  -She ambulated in crocs with 1  crutch with cuing and instruction for proper gait sequencing, heel strike, heel to toe gait, and WBAT precautions.  -Performed Mirror walking with 1 crutch with cuing for increased Wb'ing thru L LE and L LE positioning.  Pt reports 2/10 pain with ambulating with 1 crutch.  -Standing Weight shifts (in crocs) f/b in staggered stance and s/s with bilat UE assist 2x10 reps   L ankle AROM:   DF: initial:  -12, current:  -7 deg L Knee flexion AROM:  128 deg       -Therapeutic Exercise: -Reviewed response to prior Rx, HEP compliance, current function, and pain levels.  -Assessed ROM. -Pt received L ankle PROM in DF, PF, Eve, and Inv.  -Pt received gastroc and soleus stretches 2x30 seconds each -Pt performed:   ankle pumps in longsitting 2 sets of 10 reps  Ankle ABC's x 1 rep Ankle circles 2x10 reps cw and ccw Seated DF/PF and cw and ccw circles on fitter first 2x10 reps each Ankle eve/inv with towel seated 2x10 reps -Reviewed and updated HEP    -See below for pt education   PATIENT EDUCATION:  Education details:  Answered Pt's questions.  Educated pt concerning post op expectations.  Reviewed and updated HEP.  Gave pt a handout and educated pt in correct form and appropriate frequency  Gait training Person educated: Patient Education method: Explanation, Demonstration, Tactile cues, Verbal cues, handout Education comprehension: verbalized understanding, returned demonstration, verbal cues required, and tactile cues required     HOME EXERCISE PROGRAM: Access Code: [email or web address] URL: [email or web address] Date: 01/20/2021 Prepared by: Aaron Edelman  Exercises ANKLE PUMPS - 2-3 x daily - 7 x weekly - 2 sets - 10 reps Seated Toe Curl - 3 x daily - 7 x weekly - 2-3 sets - 10 reps Long Sitting Calf Stretch with Strap - 2 x daily - 7 x weekly - 2-3 reps - 20 seconds hold Supine Heel Slide - 2 x daily - 7 x weekly - 2 sets - 10 reps Supine Active Straight Leg Raise - 1 x daily  - 6-7 x weekly - 2 sets - 10 reps Seated Ankle Alphabet - 2-3 x daily - 7 x weekly - 1 reps Seated Ankle Circles - 2 x daily - 7 x weekly - 2 sets - 10 reps Sidelying Hip Abduction - 1 x daily - 5-7 x weekly - 2 sets - 10 reps Ankle Inversion Eversion Towel Slide - 2 x daily - 7 x weekly - 2 sets - 10 reps     ASSESSMENT:   CLINICAL IMPRESSION: Pt has tightness and limitations with L ankle ROM.  She is limited in DF ROM though is improving with ROM.  She is improving with actively performing DF though had more difficulty with performing active PF today.  Pt demonstrates improved ER AROM though has difficulty performing inversion.  Pt required verbal, visual, and tactile cuing for improved form and reduced compensatory movements.  Pt seems to evert foot when trying to perform DF.  Pt is improving with quality of gait an demonstrated improved heel to toe gait.  She has minimally increased ER of L LE though has improved.  Pt responded well to Rx having no c/o's after Rx.  Pt should benefit from continued skilled PT to address impairments and goals and to assist with restoring PLOF.      Objective impairments include Abnormal gait, decreased activity tolerance, decreased endurance, decreased mobility, difficulty walking, decreased ROM, decreased strength, hypomobility, impaired flexibility, and pain. These impairments are limiting patient from cleaning, community activity, meal prep, occupation, laundry, yard work, and shopping.         GOALS:     SHORT TERM GOALS:   STG Name Target Date Goal status  1 Pt will be independent and compliant with HEP for improved pain, ROM, strength, and function.  Baseline:  01/18/2021 INITIAL  2 Pt will demo at least 8-10 deg increase in AROM of DF, PF, and Inv for improved stiffness and mobility.   Baseline:  01/25/2021 INITIAL  3 Pt will progress with WB'ing as MD allows without adverse effects for improved gait and mobility Baseline: 01/25/2021 INITIAL  4  Pt will perform a 6 inch step up with good control and form.  Baseline: 02/22/2021 INITIAL  5 Pt will be able to perform her daily transfers without difficulty. Baseline: 02/22/2021 INITIAL                      LONG TERM GOALS:    LTG Name Target Date Goal status  1 Pt will ambulate with a normalized heel to toe gait pattern without limping.  Baseline: 03/08/2021 INITIAL  2 Pt will ambulate community distance without an AD without significant pain or difficulty.   Baseline: 03/08/2021 INITIAL  3 Pt will perform stairs with a reciprocal gait without rail with good control.  Baseline: 03/08/2021 INITIAL  4 Pt will demo at least 10 deg of L ankle DF AROM for improved stiffness and gait.  Baseline: 03/08/2021 INITIAL  5 Pt will demo 5/5 L ankle strength in DF, Eve, and Inv and WFL for PF (tested in NWB'ing) for improved tolerance to daily mobility and taking care of kids and to assist in restoring PLOF.  Baseline: 03/08/2021 INITIAL  6 Pt will be able to perform her normal standing activities including household chores without significant pain or difficulty.  Baseline: 03/08/2021 INITIAL             PLAN:   PLANNED INTERVENTIONS: Therapeutic exercises, Therapeutic activity, Neuro Muscular re-education, Balance training, Gait training, Patient/Family education, Joint mobilization, Stair training, Aquatic Therapy, Electrical stimulation, Cryotherapy, Taping, and Manual therapy   PLAN FOR NEXT SESSION: Cont with ankle ROM and knee flexion PROM.  Work on active DF ROM.  Gait training and WB'ing per MD orders.  Ice as needed to reduce swelling, soreness, and pain.       Audie Clear III PT, DPT 01/20/21 10:41 PM

## 2021-01-22 ENCOUNTER — Encounter (HOSPITAL_BASED_OUTPATIENT_CLINIC_OR_DEPARTMENT_OTHER): Payer: Self-pay | Admitting: Physical Therapy

## 2021-01-22 ENCOUNTER — Ambulatory Visit (HOSPITAL_BASED_OUTPATIENT_CLINIC_OR_DEPARTMENT_OTHER): Payer: 59 | Admitting: Physical Therapy

## 2021-01-22 ENCOUNTER — Other Ambulatory Visit: Payer: Self-pay

## 2021-01-22 DIAGNOSIS — M25672 Stiffness of left ankle, not elsewhere classified: Secondary | ICD-10-CM

## 2021-01-22 DIAGNOSIS — M25572 Pain in left ankle and joints of left foot: Secondary | ICD-10-CM

## 2021-01-22 DIAGNOSIS — R262 Difficulty in walking, not elsewhere classified: Secondary | ICD-10-CM

## 2021-01-22 DIAGNOSIS — M6281 Muscle weakness (generalized): Secondary | ICD-10-CM

## 2021-01-22 NOTE — Therapy (Signed)
OUTPATIENT PHYSICAL THERAPY TREATMENT NOTE   Patient Name: Janet Dalton MRN: [id number] DOB:03-12-1975, 45 y.o., female Today's Date: 01/22/2021  PCP: Joycelyn Rua, MD  PT End of Session - 01/22/21 1026     Visit Number 7    Number of Visits 20    Date for PT Re-Evaluation 03/08/21    Authorization Type [employer]    PT Start Time 1022    PT Stop Time 1104    PT Time Calculation (min) 42 min    Equipment Utilized During Treatment Other (comment)   1 crutch   Activity Tolerance Patient tolerated treatment well    Behavior During Therapy WFL for tasks assessed/performed             History reviewed. No pertinent past medical history. Past Surgical History:  Procedure Laterality Date   EYE SURGERY     KNEE SURGERY Left    TIBIA IM NAIL INSERTION Left 11/30/2020   Procedure: INTRAMEDULLARY (IM) NAIL TIBIAL AND TIBIAL PLATE;  Surgeon: Huel Cote, MD;  Location: [facility] OR;  Service: Orthopedics;  Laterality: Left;   Patient Active Problem List   Diagnosis Date Noted   Left tibial fracture 11/29/2020    REFERRING PROVIDER: Huel Cote, MD   REFERRING DIAG: S82.52XD (ICD-10-CM) - Closed displaced fracture of medial malleolus of left tibia with routine healing, subsequent encounter    THERAPY DIAG:  Pain in left ankle and joints of left foot   Stiffness of left ankle, not elsewhere classified   Muscle weakness (generalized)   Difficulty in walking, not elsewhere classified   ONSET DATE:  Injury 11/29/2020 / Surgery 11/30/2020   SUBJECTIVE:    SUBJECTIVE STATEMENT: -She is limited with performing her ADLs/IADLs and normal functional mobility skills.  Pt is limited with ambulation, standing activities, and performing household chores.  Pt has 2 kids and has difficulty taking care of kids currently.   -Pt reports compliance with HEP.  Pt reports she had increased pain later in the evening after prior Rx and reports having more pain the past couple of days.  Pt reports  3/10 pain ambulating with 1 crutch.  Pt reports she wears crocs when she ambulates outside of home.  Pt reports improved sensation on dorsum of foot.  Pt states she has walked some in home without crutch without adverse effects.   PERTINENT HISTORY: MD notes/orders:   advance to weightbearing as tolerated on the left leg.  Prior L knee surgeries x 2 (approx 7 years ago)   PAIN:  Are you having pain? Yes NRPS scale: 2/10 currently at rest, 0/10 best, 4/10 worst Pain location: anterior ankle and foot, lateral ankle, and L anterior knee Pain orientation: Left      PRECAUTIONS: Other: per surgery and WB'ing orders per MD.  See below    WEIGHT BEARING RESTRICTIONS Yes; weightbearing as tolerated on the left leg.    PLOF: Independent;  Pt ambulated without an AD.  Pt was able to ambulate community distance and perform all of her normal functional mobility skills independently without limitation.  Pt was able to take care of her kids without difficulty.     PATIENT GOALS:  return to PLOF     OBJECTIVE:    DIAGNOSTIC FINDINGS:  -Pt had x rays and a CT scan.  X rays on 12/14/2020 showed ORIF distal tibial fracture and comminuted distal fibular fracture in anatomic alignment.  -Pt had x rays on 12/28/2020 which indicated:  (Per imaging in Epic) 1. ORIF distal  tibial fracture peers uncomplicated. There are minimal changes of healing. 2. Distal fibular fracture appears stable. No significant callus formation.            TODAY'S TREATMENT:           OBSERVATION:  The dark spots on her medial to central dorsum of foot are significantly less now after pt has peeled dry skin away.  Dry skin has improved.       GAIT TRAINING:  Assistive device utilized: Crutch  -She ambulated in clinic in crocs with 1 crutch with cuing and instruction for increased Wb'ing, heel strike, heel to toe gait, and WBAT precautions.  -Standing Weight shifts (in crocs) f/b in staggered stance and s/s with bilat UE  assist 2x10 reps -Standing on airex pad with cues to even Wb'ing x 1 min   L ankle AROM/PROM:   DF: Initial:  -12 deg, Current:  -2 / 7 deg PF:  39/46 deg L Knee flexion AROM:  128 deg       -Therapeutic Exercise: -Reviewed response to prior Rx, HEP compliance, current function, and pain levels.  -Assessed ROM. -Pt received L ankle PROM in DF, PF, Eve, and Inv.  -Pt received gastroc and soleus stretches 2x30 seconds each -Pt performed:   ankle pumps in longsitting 2 sets of 10 reps  S/L'ing DF 2x10 reps Ankle ABC's x 1 rep Seated DF/PF and cw and ccw circles on fitter first 2x10 reps each Ankle eve/inv with towel seated 2x10 reps Standing with FT x 1 min     -See below for pt education   PATIENT EDUCATION:  Education details:  instructed pt to use a tennis shoe with walking instead of crocs.  Answered Pt's questions.  Educated pt concerning post op expectations.  HEP.  Gait training Person educated: Patient Education method: Explanation, Demonstration, Tactile cues, Verbal cues, handout Education comprehension: verbalized understanding, returned demonstration, verbal cues required, and tactile cues required     HOME EXERCISE PROGRAM: Access Code: [email or web address] URL: [email or web address] Date: 01/20/2021 Prepared by: Aaron Edelman  Exercises ANKLE PUMPS - 2-3 x daily - 7 x weekly - 2 sets - 10 reps Seated Toe Curl - 3 x daily - 7 x weekly - 2-3 sets - 10 reps Long Sitting Calf Stretch with Strap - 2 x daily - 7 x weekly - 2-3 reps - 20 seconds hold Supine Heel Slide - 2 x daily - 7 x weekly - 2 sets - 10 reps Supine Active Straight Leg Raise - 1 x daily - 6-7 x weekly - 2 sets - 10 reps Seated Ankle Alphabet - 2-3 x daily - 7 x weekly - 1 reps Seated Ankle Circles - 2 x daily - 7 x weekly - 2 sets - 10 reps Sidelying Hip Abduction - 1 x daily - 5-7 x weekly - 2 sets - 10 reps Ankle Inversion Eversion Towel Slide - 2 x daily - 7 x weekly - 2 sets - 10  reps     ASSESSMENT:   CLINICAL IMPRESSION: Pt is 7 weeks and 5 days post op and is progressing slowly and steadily in PT.  She continues to have tightness and limitations with L ankle ROM though is making progress.  She had her best measurements of DF today.  She is improving with active DF and Eve ROM, and has more difficulty with inversion.  Pt is improving with quality of gait and demonstrated improved heel to toe gait with crutch.  Pt also demonstrates  improved Wb'ing thru L LE with gait.  She has minimally increased ER of L LE though has improved.  Pt tried a couple of steps without crutch though had a significant limp.  Pt responded well to Rx having no c/o's after Rx.  Pt should benefit from continued skilled PT to address impairments and goals and to assist with restoring PLOF.       Objective impairments include Abnormal gait, decreased activity tolerance, decreased endurance, decreased mobility, difficulty walking, decreased ROM, decreased strength, hypomobility, impaired flexibility, and pain. These impairments are limiting patient from cleaning, community activity, meal prep, occupation, laundry, yard work, and shopping.         GOALS:     SHORT TERM GOALS:   STG Name Target Date Goal status  1 Pt will be independent and compliant with HEP for improved pain, ROM, strength, and function.  Baseline:  01/18/2021 INITIAL  2 Pt will demo at least 8-10 deg increase in AROM of DF, PF, and Inv for improved stiffness and mobility.   Baseline:  01/25/2021 INITIAL  3 Pt will progress with WB'ing as MD allows without adverse effects for improved gait and mobility Baseline: 01/25/2021 INITIAL  4 Pt will perform a 6 inch step up with good control and form.  Baseline: 02/22/2021 INITIAL  5 Pt will be able to perform her daily transfers without difficulty. Baseline: 02/22/2021 INITIAL                      LONG TERM GOALS:    LTG Name Target Date Goal status  1 Pt will ambulate with a  normalized heel to toe gait pattern without limping.  Baseline: 03/08/2021 INITIAL  2 Pt will ambulate community distance without an AD without significant pain or difficulty.   Baseline: 03/08/2021 INITIAL  3 Pt will perform stairs with a reciprocal gait without rail with good control.  Baseline: 03/08/2021 INITIAL  4 Pt will demo at least 10 deg of L ankle DF AROM for improved stiffness and gait.  Baseline: 03/08/2021 INITIAL  5 Pt will demo 5/5 L ankle strength in DF, Eve, and Inv and WFL for PF (tested in NWB'ing) for improved tolerance to daily mobility and taking care of kids and to assist in restoring PLOF.  Baseline: 03/08/2021 INITIAL  6 Pt will be able to perform her normal standing activities including household chores without significant pain or difficulty.  Baseline: 03/08/2021 INITIAL             PLAN:   PLANNED INTERVENTIONS: Therapeutic exercises, Therapeutic activity, Neuro Muscular re-education, Balance training, Gait training, Patient/Family education, Joint mobilization, Stair training, Aquatic Therapy, Electrical stimulation, Cryotherapy, Taping, and Manual therapy   PLAN FOR NEXT SESSION: Cont with ankle ROM and knee flexion PROM.  Work on active DF ROM.  Gait training and WB'ing per MD orders.  Ice as needed to reduce swelling, soreness, and pain.  Pt sees MD on Monday.       Audie Clear III PT, DPT 01/22/21 2:31 PM

## 2021-01-24 NOTE — Therapy (Signed)
OUTPATIENT PHYSICAL THERAPY TREATMENT NOTE   Patient Name: Janet Dalton MRN: [id number] DOB:1975/09/02, 45 y.o., female Today's Date: 01/26/2021  PCP: Joycelyn Rua, MD  PT End of Session - 01/25/21 1002     Visit Number 8    Number of Visits 20    Date for PT Re-Evaluation 03/08/21    Authorization Type [employer]    Progress Note Due on Visit --   01/28/2021   PT Start Time 0950    PT Stop Time 1023    PT Time Calculation (min) 33 min    Equipment Utilized During Treatment Other (comment)   1 crutch   Activity Tolerance Patient tolerated treatment well    Behavior During Therapy [facility] for tasks assessed/performed              History reviewed. No pertinent past medical history. Past Surgical History:  Procedure Laterality Date   EYE SURGERY     KNEE SURGERY Left    TIBIA IM NAIL INSERTION Left 11/30/2020   Procedure: INTRAMEDULLARY (IM) NAIL TIBIAL AND TIBIAL PLATE;  Surgeon: Huel Cote, MD;  Location: [facility] OR;  Service: Orthopedics;  Laterality: Left;   Patient Active Problem List   Diagnosis Date Noted   Left tibial fracture 11/29/2020    REFERRING PROVIDER: Huel Cote, MD   REFERRING DIAG: S82.52XD (ICD-10-CM) - Closed displaced fracture of medial malleolus of left tibia with routine healing, subsequent encounter    THERAPY DIAG:  Pain in left ankle and joints of left foot   Stiffness of left ankle, not elsewhere classified   Muscle weakness (generalized)   Difficulty in walking, not elsewhere classified   ONSET DATE:  Injury 11/29/2020 / Surgery 11/30/2020   SUBJECTIVE:    SUBJECTIVE STATEMENT: -Pt is 8 weeks s/p left intramedullary nail of the tibia on 11/30/2020.  -She is limited with performing her ADLs/IADLs and normal functional mobility skills.  Pt is limited with ambulation, standing activities, and performing household chores.  Pt has 2 kids and has difficulty taking care of kids currently.   -Pt reports compliance with HEP.  Pt reports  improved sensation on dorsum of foot.  Pt states she has walked some in home without crutch without adverse effects.  Pt went shopping with her mother yesterday and walked over hour in the store.  Pt reports she didn't have increased pain afterwards though did have some in the evening and this AM.  Pt reports she had a little increased pain after prior RX.  Pt reports improvement in her ROM.  Pt states she has been having pain on R sided lumbar probably due to increased Wb'ing thru R LE.  Pt states MD informed her to not worry about the limp and she can get rid of the crutch.  Pt states MD informed her the numbness is normal and everything looks good.  Pt had x rays.     PERTINENT HISTORY: MD notes/orders:   WBAT and Pt states she can remove crutch per MD.  Prior L knee surgeries x 2 (approx 7 years ago)   PAIN:  Are you having pain? Yes NRPS scale: 3/10 currently at rest, 0/10 best, 4/10 worst Pain location: anterior ankle and foot, lateral ankle, and L anterior knee Pain orientation: Left      PRECAUTIONS: Other: per surgery and WB'ing orders per MD.  See below    WEIGHT BEARING RESTRICTIONS Yes; weightbearing as tolerated on the left leg.    PLOF: Independent;  Pt ambulated without an AD.  Pt was able to ambulate community distance and perform all of her normal functional mobility skills independently without limitation.  Pt was able to take care of her kids without difficulty.     PATIENT GOALS:  return to PLOF     OBJECTIVE:    DIAGNOSTIC FINDINGS:  -Pt had x rays and a CT scan.  X rays on 12/14/2020 showed ORIF distal tibial fracture and comminuted distal fibular fracture in anatomic alignment.  -Pt had x rays on 12/28/2020 which indicated:  (Per imaging in Epic) 1. ORIF distal tibial fracture peers uncomplicated. There are minimal changes of healing. 2. Distal fibular fracture appears stable. No significant callus formation.            TODAY'S TREATMENT:            GAIT  TRAINING:  Assistive device utilized: Crutch  -She ambulated in clinic in crocs with 1 crutch with cuing and instruction for increased Wb'ing, heel strike, heel to toe gait, and WBAT precautions.  -Standing Weight shifts (in tennis shoes) f/b in staggered stance and s/s with bilat UE assist on airex pad2x10 reps -Standing on airex pad with cues to even WB'ing x 1 min     -Therapeutic Exercise: -Reviewed response to prior Rx, HEP compliance, current function, and pain levels.  -Pt received L ankle PROM in DF, PF, Eve, and Inv.  -Pt received gastroc and soleus stretches 2x30 seconds each -Pt performed:   ankle pumps in longsitting 2 sets of 10 reps  S/L'ing DF/PF 2x10 reps Ankle ABC's x 1 rep Seated DF/PF and cw and ccw circles on fitter first 2x10 reps each      -See below for pt education   PATIENT EDUCATION:  Education details:  Answered Pt's questions.  Educated pt concerning post op expectations.  HEP.  Gait training Person educated: Patient Education method: Explanation, Demonstration, Tactile cues, Verbal cues, handout Education comprehension: verbalized understanding, returned demonstration, verbal cues required, and tactile cues required     HOME EXERCISE PROGRAM: Access Code: [email or web address] URL: [email or web address] Date: 01/20/2021 Prepared by: Aaron Edelman  Exercises ANKLE PUMPS - 2-3 x daily - 7 x weekly - 2 sets - 10 reps Seated Toe Curl - 3 x daily - 7 x weekly - 2-3 sets - 10 reps Long Sitting Calf Stretch with Strap - 2 x daily - 7 x weekly - 2-3 reps - 20 seconds hold Supine Heel Slide - 2 x daily - 7 x weekly - 2 sets - 10 reps Supine Active Straight Leg Raise - 1 x daily - 6-7 x weekly - 2 sets - 10 reps Seated Ankle Alphabet - 2-3 x daily - 7 x weekly - 1 reps Seated Ankle Circles - 2 x daily - 7 x weekly - 2 sets - 10 reps Sidelying Hip Abduction - 1 x daily - 5-7 x weekly - 2 sets - 10 reps Ankle Inversion Eversion Towel Slide - 2 x daily - 7  x weekly - 2 sets - 10 reps     ASSESSMENT:   CLINICAL IMPRESSION: Pt is 8 weeks post op and is progressing slowly and steadily in PT.  She continues to have tightness and limitations with L ankle ROM though is making progress including with DF. She is improving with active DF and Eve ROM, and has more difficulty with inversion.  Pt brought tennis shoes today and performed gait training with shoes.  Pt is improving with quality of gait and demonstrated improved heel to  toe gait with crutch.  Pt also demonstrates improved Wb'ing thru L LE with gait.  Pt responded well to Rx having no c/o's after Rx.  Pt should benefit from continued skilled PT to address impairments and goals and to assist with restoring PLOF.         Objective impairments include Abnormal gait, decreased activity tolerance, decreased endurance, decreased mobility, difficulty walking, decreased ROM, decreased strength, hypomobility, impaired flexibility, and pain. These impairments are limiting patient from cleaning, community activity, meal prep, occupation, laundry, yard work, and shopping.         GOALS:     SHORT TERM GOALS:   STG Name Target Date Goal status  1 Pt will be independent and compliant with HEP for improved pain, ROM, strength, and function.  Baseline:  01/18/2021 INITIAL  2 Pt will demo at least 8-10 deg increase in AROM of DF, PF, and Inv for improved stiffness and mobility.   Baseline:  01/25/2021 INITIAL  3 Pt will progress with WB'ing as MD allows without adverse effects for improved gait and mobility Baseline: 01/25/2021 INITIAL  4 Pt will perform a 6 inch step up with good control and form.  Baseline: 02/22/2021 INITIAL  5 Pt will be able to perform her daily transfers without difficulty. Baseline: 02/22/2021 INITIAL                      LONG TERM GOALS:    LTG Name Target Date Goal status  1 Pt will ambulate with a normalized heel to toe gait pattern without limping.  Baseline: 03/08/2021  INITIAL  2 Pt will ambulate community distance without an AD without significant pain or difficulty.   Baseline: 03/08/2021 INITIAL  3 Pt will perform stairs with a reciprocal gait without rail with good control.  Baseline: 03/08/2021 INITIAL  4 Pt will demo at least 10 deg of L ankle DF AROM for improved stiffness and gait.  Baseline: 03/08/2021 INITIAL  5 Pt will demo 5/5 L ankle strength in DF, Eve, and Inv and WFL for PF (tested in NWB'ing) for improved tolerance to daily mobility and taking care of kids and to assist in restoring PLOF.  Baseline: 03/08/2021 INITIAL  6 Pt will be able to perform her normal standing activities including household chores without significant pain or difficulty.  Baseline: 03/08/2021 INITIAL             PLAN:   PLANNED INTERVENTIONS: Therapeutic exercises, Therapeutic activity, Neuro Muscular re-education, Balance training, Gait training, Patient/Family education, Joint mobilization, Stair training, Aquatic Therapy, Electrical stimulation, Cryotherapy, Taping, and Manual therapy   PLAN FOR NEXT SESSION: Cont with ankle ROM and knee flexion PROM.  Work on active DF ROM.  Gait training and WB'ing per MD orders.  Ice as needed to reduce swelling, soreness, and pain.  Pt sees MD on Monday.       Audie Clear III PT, DPT 01/26/21 12:17 PM

## 2021-01-25 ENCOUNTER — Other Ambulatory Visit: Payer: Self-pay

## 2021-01-25 ENCOUNTER — Ambulatory Visit (HOSPITAL_BASED_OUTPATIENT_CLINIC_OR_DEPARTMENT_OTHER)
Admission: RE | Admit: 2021-01-25 | Discharge: 2021-01-25 | Disposition: A | Discharge status: Home / Self Care | Payer: 59 | Source: Ambulatory Visit | Attending: Orthopaedic Surgery | Admitting: Orthopaedic Surgery

## 2021-01-25 ENCOUNTER — Ambulatory Visit (INDEPENDENT_AMBULATORY_CARE_PROVIDER_SITE_OTHER): Payer: 59 | Admitting: Orthopaedic Surgery

## 2021-01-25 ENCOUNTER — Other Ambulatory Visit (HOSPITAL_BASED_OUTPATIENT_CLINIC_OR_DEPARTMENT_OTHER): Payer: Self-pay | Admitting: Orthopaedic Surgery

## 2021-01-25 ENCOUNTER — Encounter (HOSPITAL_BASED_OUTPATIENT_CLINIC_OR_DEPARTMENT_OTHER): Payer: Self-pay | Admitting: Physical Therapy

## 2021-01-25 ENCOUNTER — Ambulatory Visit (HOSPITAL_BASED_OUTPATIENT_CLINIC_OR_DEPARTMENT_OTHER): Payer: 59 | Admitting: Physical Therapy

## 2021-01-25 DIAGNOSIS — R262 Difficulty in walking, not elsewhere classified: Secondary | ICD-10-CM

## 2021-01-25 DIAGNOSIS — S8252XD Displaced fracture of medial malleolus of left tibia, subsequent encounter for closed fracture with routine healing: Secondary | ICD-10-CM

## 2021-01-25 DIAGNOSIS — M25572 Pain in left ankle and joints of left foot: Secondary | ICD-10-CM

## 2021-01-25 DIAGNOSIS — M25672 Stiffness of left ankle, not elsewhere classified: Secondary | ICD-10-CM

## 2021-01-25 DIAGNOSIS — M6281 Muscle weakness (generalized): Secondary | ICD-10-CM

## 2021-01-25 NOTE — Progress Notes (Signed)
                                 Post Operative Evaluation    Procedure/Date of Surgery: 11/30/20 -left tibial nail for tib-fib fracture  Interval History:   01/25/2021: Today doing very well.  She continues to advance her weightbearing.  She is using only 1 crutch on her left side.  She is now walking with a normal shoe weightbearing has improved significantly.  PMH/PSH/Family History/Social History/Meds/Allergies:   No past medical history on file. Past Surgical History:  Procedure Laterality Date   EYE SURGERY     KNEE SURGERY Left    TIBIA IM NAIL INSERTION Left 11/30/2020   Procedure: INTRAMEDULLARY (IM) NAIL TIBIAL AND TIBIAL PLATE;  Surgeon: Huel Cote, MD;  Location: [facility] OR;  Service: Orthopedics;  Laterality: Left;   Social History   Socioeconomic History   Marital status: Married    Spouse name: Not on file   Number of children: Not on file   Years of education: Not on file   Highest education level: Not on file  Occupational History   Not on file  Tobacco Use   Smoking status: Never   Smokeless tobacco: Never  Substance and Sexual Activity   Alcohol use: Never   Drug use: Never   Sexual activity: Not on file  Other Topics Concern   Not on file  Social History Narrative   Not on file   Social Determinants of Health   Financial Resource Strain: Not on file  Food Insecurity: Not on file  Transportation Needs: Not on file  Physical Activity: Not on file  Stress: Not on file  Social Connections: Not on file   No family history on file. No Known Allergies Current Outpatient Medications  Medication Sig Dispense Refill   aspirin EC 325 MG tablet Take 1 tablet (325 mg total) by mouth daily. 30 tablet 0   oxycodone (OXY-IR) 5 MG capsule Take 1 capsule (5 mg total) by mouth every 4 (four) hours as needed (severe pain). 20 capsule 0   No current facility-administered medications for this visit.   No results found.  Review of Systems:   A ROS was  performed including pertinent positives and negatives as documented in the HPI.   Musculoskeletal Exam:     Incisions are well-healed with no erythema or drainage.  Left knee range of motion is from 0 to 125 degrees.  Left ankle dorsiflexion is to 25 degrees plantarflexion is 30 degrees.  Trace swelling about the ankle.  Small patch of numbness about the dorsum of the left foot   Imaging:    X-rays 4 views left tibia Status post intramedullary nail left tibia without evidence of hardware complication  I personally reviewed and interpreted the radiographs.   Assessment:   45 year old female status post left tibial nail doing extremely well.  She is now 8 weeks out.  I would like her to wean off of the scratch and continue to weight-bear as best as tolerated on the left leg.  She will continue physical therapy  Plan :    -Return to clinic in 8 weeks   I personally saw and evaluated the patient, and participated in the management and treatment plan.  Huel Cote, MD Attending Physician, Orthopedic Surgery  This document was dictated using Dragon voice recognition software. A reasonable attempt at proof reading has been made to minimize errors.

## 2021-01-27 ENCOUNTER — Ambulatory Visit (HOSPITAL_BASED_OUTPATIENT_CLINIC_OR_DEPARTMENT_OTHER): Payer: 59 | Admitting: Physical Therapy

## 2021-01-27 ENCOUNTER — Other Ambulatory Visit: Payer: Self-pay

## 2021-01-27 ENCOUNTER — Encounter (HOSPITAL_BASED_OUTPATIENT_CLINIC_OR_DEPARTMENT_OTHER): Payer: Self-pay | Admitting: Physical Therapy

## 2021-01-27 DIAGNOSIS — R262 Difficulty in walking, not elsewhere classified: Secondary | ICD-10-CM

## 2021-01-27 DIAGNOSIS — M25672 Stiffness of left ankle, not elsewhere classified: Secondary | ICD-10-CM

## 2021-01-27 DIAGNOSIS — M25572 Pain in left ankle and joints of left foot: Secondary | ICD-10-CM | POA: Diagnosis not present

## 2021-01-27 DIAGNOSIS — M6281 Muscle weakness (generalized): Secondary | ICD-10-CM

## 2021-01-27 NOTE — Therapy (Signed)
OUTPATIENT PHYSICAL THERAPY TREATMENT NOTE   Patient Name: Janet Dalton MRN: [id number] DOB:12-15-1975, 45 y.o., female Today's Date: 01/27/2021  PCP: Joycelyn Rua, MD  PT End of Session - 01/27/21 1241     Visit Number 9    Number of Visits 20    Date for PT Re-Evaluation 03/08/21    Authorization Type [employer]    Progress Note Due on Visit --   01/28/2021   PT Start Time 1015    PT Stop Time 1055    PT Time Calculation (min) 40 min    Equipment Utilized During Treatment Other (comment)   1 crutch   Activity Tolerance Patient tolerated treatment well    Behavior During Therapy [facility] for tasks assessed/performed               History reviewed. No pertinent past medical history. Past Surgical History:  Procedure Laterality Date   EYE SURGERY     KNEE SURGERY Left    TIBIA IM NAIL INSERTION Left 11/30/2020   Procedure: INTRAMEDULLARY (IM) NAIL TIBIAL AND TIBIAL PLATE;  Surgeon: Huel Cote, MD;  Location: [facility] OR;  Service: Orthopedics;  Laterality: Left;   Patient Active Problem List   Diagnosis Date Noted   Left tibial fracture 11/29/2020    REFERRING PROVIDER: Huel Cote, MD   REFERRING DIAG: S82.52XD (ICD-10-CM) - Closed displaced fracture of medial malleolus of left tibia with routine healing, subsequent encounter    THERAPY DIAG:  Pain in left ankle and joints of left foot   Stiffness of left ankle, not elsewhere classified   Muscle weakness (generalized)   Difficulty in walking, not elsewhere classified   ONSET DATE:  Injury 11/29/2020 / Surgery 11/30/2020   SUBJECTIVE:    SUBJECTIVE STATEMENT: -Pt is 8 weeks s/p left intramedullary nail of the tibia on 11/30/2020.  Pt states she did not have an increase in pain since last session. Pt presents to session today without crutch.  PERTINENT HISTORY: MD notes/orders:   WBAT and Pt states she can remove crutch per MD.  Prior L knee surgeries x 2 (approx 7 years ago)   PAIN:  Are you having pain?  Yes NRPS scale: 3/10 currently at rest, 0/10 best, 4/10 worst Pain location: anterior ankle and foot, lateral ankle, and L anterior knee Pain orientation: Left      PRECAUTIONS: Other: per surgery and WB'ing orders per MD.  See below    WEIGHT BEARING RESTRICTIONS Yes; weightbearing as tolerated on the left leg.    PLOF: Independent;  Pt ambulated without an AD.  Pt was able to ambulate community distance and perform all of her normal functional mobility skills independently without limitation.  Pt was able to take care of her kids without difficulty.     PATIENT GOALS:  return to PLOF     OBJECTIVE:    DIAGNOSTIC FINDINGS:  -Pt had x rays and a CT scan.  X rays on 12/14/2020 showed ORIF distal tibial fracture and comminuted distal fibular fracture in anatomic alignment.  -Pt had x rays on 12/28/2020 which indicated:  (Per imaging in Epic) 1. ORIF distal tibial fracture peers uncomplicated. There are minimal changes of healing. 2. Distal fibular fracture appears stable. No significant callus formation.            TODAY'S TREATMENT:           Heel toe rocking- cuing for toe off, heel strike, and increasing stance time on L 20x STS from chair 4x10 YTB at  knees, cued for equal WB and knee/hip alignment Standing gastroc able stretching 30s 3x (unable to do soleus) HR 2x10 UE support  Joint mob: L TCJ posterior glide for DF grade II: L ankle LAD grade II oscillation with DF assist     PATIENT EDUCATION:  Education details: imaging explanation, anatomy, exercise progression, envelope of function, HEP,  Person educated: Patient Education method: Explanation, Demonstration, Tactile cues, Verbal cues, handout Education comprehension: verbalized understanding, returned demonstration, verbal cues required, and tactile cues required     HOME EXERCISE PROGRAM: Access Code: [email or web address] URL: [email or web address] Date: 01/20/2021 Prepared by: Aaron Edelman  Exercises ANKLE PUMPS - 2-3 x daily - 7 x weekly - 2 sets - 10 reps Seated Toe Curl - 3 x daily - 7 x weekly - 2-3 sets - 10 reps Long Sitting Calf Stretch with Strap - 2 x daily - 7 x weekly - 2-3 reps - 20 seconds hold Supine Heel Slide - 2 x daily - 7 x weekly - 2 sets - 10 reps Supine Active Straight Leg Raise - 1 x daily - 6-7 x weekly - 2 sets - 10 reps Seated Ankle Alphabet - 2-3 x daily - 7 x weekly - 1 reps Seated Ankle Circles - 2 x daily - 7 x weekly - 2 sets - 10 reps Sidelying Hip Abduction - 1 x daily - 5-7 x weekly - 2 sets - 10 reps Ankle Inversion Eversion Towel Slide - 2 x daily - 7 x weekly - 2 sets - 10 reps     ASSESSMENT:   CLINICAL IMPRESSION: Pt able to tolerance WB exercise today and begin to work on transfer and gait mechanics in order to improve L LE WB and stance time. Pt still with significant DF limitation, especially in CKC. Pt able to increase stance time by end of session but continues to demo antalgic gait pattern. Plan to assess response to today's session and incorporate CKC WB as tolerated. Pt will  benefit from continued skilled PT to address impairments and goals and to assist with restoring PLOF.         Objective impairments include Abnormal gait, decreased activity tolerance, decreased endurance, decreased mobility, difficulty walking, decreased ROM, decreased strength, hypomobility, impaired flexibility, and pain. These impairments are limiting patient from cleaning, community activity, meal prep, occupation, laundry, yard work, and shopping.         GOALS:     SHORT TERM GOALS:   STG Name Target Date Goal status  1 Pt will be independent and compliant with HEP for improved pain, ROM, strength, and function.  Baseline:  01/18/2021 INITIAL  2 Pt will demo at least 8-10 deg increase in AROM of DF, PF, and Inv for improved stiffness and mobility.   Baseline:  01/25/2021 INITIAL  3 Pt will progress with WB'ing as MD allows without  adverse effects for improved gait and mobility Baseline: 01/25/2021 INITIAL  4 Pt will perform a 6 inch step up with good control and form.  Baseline: 02/22/2021 INITIAL  5 Pt will be able to perform her daily transfers without difficulty. Baseline: 02/22/2021 INITIAL                      LONG TERM GOALS:    LTG Name Target Date Goal status  1 Pt will ambulate with a normalized heel to toe gait pattern without limping.  Baseline: 03/08/2021 INITIAL  2 Pt will ambulate community distance without an AD without significant  pain or difficulty.   Baseline: 03/08/2021 INITIAL  3 Pt will perform stairs with a reciprocal gait without rail with good control.  Baseline: 03/08/2021 INITIAL  4 Pt will demo at least 10 deg of L ankle DF AROM for improved stiffness and gait.  Baseline: 03/08/2021 INITIAL  5 Pt will demo 5/5 L ankle strength in DF, Eve, and Inv and WFL for PF (tested in [facility]) for improved tolerance to daily mobility and taking care of kids and to assist in restoring PLOF.  Baseline: 03/08/2021 INITIAL  6 Pt will be able to perform her normal standing activities including household chores without significant pain or difficulty.  Baseline: 03/08/2021 INITIAL             PLAN:   PLANNED INTERVENTIONS: Therapeutic exercises, Therapeutic activity, Neuro Muscular re-education, Balance training, Gait training, Patient/Family education, Joint mobilization, Stair training, Aquatic Therapy, Electrical stimulation, Cryotherapy, Taping, and Manual therapy   PLAN FOR NEXT SESSION: Cont with ankle ROM and knee flexion PROM.  Work on active DF ROM.  Gait training and WB'ing per MD orders.  Ice as needed to reduce swelling, soreness, and pain.  Pt sees MD on Monday.      Zebedee Iba PT, DPT 01/27/21 12:43 PM

## 2021-01-31 NOTE — Therapy (Signed)
OUTPATIENT PHYSICAL THERAPY TREATMENT NOTE/PROGRESS NOTE   Patient Name: Janet Dalton MRN: [id number] DOB:08/14/1975, 45 y.o., female Today's Date: 02/01/2021  PCP: Orpah Melter, MD  PT End of Session - 02/01/21 0948     Visit Number 10    Number of Visits 20    Date for PT Re-Evaluation 03/08/21    Authorization Type [employer]    PT Start Time 0940    PT Stop Time 1023    PT Time Calculation (min) 43 min    Activity Tolerance Patient tolerated treatment well    Behavior During Therapy [facility] for tasks assessed/performed                History reviewed. No pertinent past medical history. Past Surgical History:  Procedure Laterality Date   EYE SURGERY     KNEE SURGERY Left    TIBIA IM NAIL INSERTION Left 11/30/2020   Procedure: INTRAMEDULLARY (IM) NAIL TIBIAL AND TIBIAL PLATE;  Surgeon: Vanetta Mulders, MD;  Location: [facility];  Service: Orthopedics;  Laterality: Left;   Patient Active Problem List   Diagnosis Date Noted   Left tibial fracture 11/29/2020    REFERRING PROVIDER: Vanetta Mulders, MD   REFERRING DIAG: S82.52XD (ICD-10-CM) - Closed displaced fracture of medial malleolus of left tibia with routine healing, subsequent encounter    THERAPY DIAG:  Pain in left ankle and joints of left foot   Stiffness of left ankle, not elsewhere classified   Muscle weakness (generalized)   Difficulty in walking, not elsewhere classified   ONSET DATE:  Injury 11/29/2020 / Surgery 11/30/2020   SUBJECTIVE:    SUBJECTIVE STATEMENT: -Pt is 9 weeks s/p left intramedullary nail of the tibia on 11/30/2020. Pt reports having no increased pain after prior Rx.  Pt presents to session today without crutch.  Pt reports her numbness is improving though continues to have numbness present.  HEP COMPLIANCE:  Pt reports compliance with HEP. FUNCTIONAL IMPROVEMENTS:  ambulation, standing, stairs, make and carry her own coffee, driving, ROM FUNCTIONAL LIMITATIONS: ambulation, taking care  of kids, household chores, stairs   PERTINENT HISTORY: MD notes/orders:   WBAT and Pt states she can remove crutch per MD.  Prior L knee surgeries x 2 (approx 7 years ago)   PAIN:  Are you having pain? Yes NRPS scale: 1-2/10 currently at rest, 0/10 best, 4/10 worst.  Pt reports having a sudden onset of central thoracic pain today.  Pain location: anterior ankle and foot, lateral ankle Pain orientation: Left      PRECAUTIONS: Other: per surgery and WB'ing orders per MD.  See below    WEIGHT BEARING RESTRICTIONS Yes; weightbearing as tolerated on the left leg.    PLOF: Independent;  Pt ambulated without an AD.  Pt was able to ambulate community distance and perform all of her normal functional mobility skills independently without limitation.  Pt was able to take care of her kids without difficulty.     PATIENT GOALS:  return to PLOF     OBJECTIVE:    TODAY'S TREATMENT:      PHYSICAL PERFORMANCE TESTING:  -Reviewed reported functional progress and deficits, HEP compliance, and pain levels.    FOTO:  31  OBSERVATION: Incisions all closed and healing nicely.  No signs of infection   L Ankle AROM/PROM:      Initial:   DF:  -12/2, PF: 62/83, Inv: 6/11, Eve:  6/6 Current:  DF:  4/9, PF: 39/47, Inv: 12/25, Eve:  9/11  GAIT:  Improving Wb'ing and stance time in L LE.  Improved heel strike though still limited in foot clearance and toe off.  Improved toe out though still present.  When pt ambulates faster, she has increased limp and favors R LE.  1-2/10 pain during gait    GAIT TRAINING:    -Pt ambulated without an AD with cuing and instruction to decrease toe out and improve heel to toe gait including increased toe off.       -Therapeutic Exercise: -Reviewed response to prior Rx.  -Pt received L ankle PROM in DF, PF, Eve, and Inv.  -Pt performed:   Seated toe raises and heel raise 2x10 reps each Seated DF/PF and cw and ccw circles on fitter first 2x10 reps  each -Updated HEP and gave pt a handout.     PATIENT EDUCATION:  Education details:  objective findings including progress/deficits, POC, and HEP.  Updated HEP and gave pt a HEP handout.  Educated pt in correct form and appropriate frequency.  Person educated: Patient Education method: Explanation, Demonstration, Tactile cues, Verbal cues, handout Education comprehension: verbalized understanding, returned demonstration, verbal cues required, and tactile cues required     HOME EXERCISE PROGRAM: Access Code: [email or web address] URL: [email or web address] Date: 02/01/2021 Prepared by: Ronny Flurry  Exercises ANKLE PUMPS - 2-3 x daily - 7 x weekly - 2 sets - 10 reps Seated Toe Curl - 3 x daily - 7 x weekly - 2-3 sets - 10 reps Long Sitting Calf Stretch with Strap - 2 x daily - 7 x weekly - 2-3 reps - 20 seconds hold Supine Heel Slide - 2 x daily - 7 x weekly - 2 sets - 10 reps Supine Active Straight Leg Raise - 1 x daily - 6-7 x weekly - 2 sets - 10 reps Seated Ankle Alphabet - 2-3 x daily - 7 x weekly - 1 reps Seated Ankle Circles - 2 x daily - 7 x weekly - 2 sets - 10 reps Sidelying Hip Abduction - 1 x daily - 5-7 x weekly - 2 sets - 10 reps Ankle Inversion Eversion Towel Slide - 2 x daily - 7 x weekly - 2 sets - 10 reps Seated Toe Raise - 2 x daily - 7 x weekly - 2 sets - 10 reps Seated Heel Raise - 2 x daily - 7 x weekly - 2 sets - 10 reps      ASSESSMENT:   CLINICAL IMPRESSION: Pt is 9 weeks post op and is progressing slowly and steadily in PT.  She continues to have tightness and limitations with L ankle ROM though is making slow progress in all directions including DF.  Pt has removed crutch and is increasing her ambulation without an AD.  Pt demonstrates improved gait pattern in the clinic without crutch though worsens when increasing gait speed.  Pt continues to be limited with toe off and favors L LE with gait though is improving.  Pt also has less pain with gait and  has improved tolerance with WB'ing.  Pt is progressing toward goals; see below for goal progress.  She responded well to Rx and should continue to benefit from continued skilled PT to address impairments and goals and to assist with restoring PLOF.       Objective impairments include Abnormal gait, decreased activity tolerance, decreased endurance, decreased mobility, difficulty walking, decreased ROM, decreased strength, hypomobility, impaired flexibility, and pain. These impairments are limiting patient from cleaning, community activity, meal prep, occupation, laundry, yard work, and  shopping.         GOALS:     SHORT TERM GOALS:   STG Name Target Date Goal status  1 Pt will be independent and compliant with HEP for improved pain, ROM, strength, and function.  Baseline:  01/18/2021 GOAL MET  2 Pt will demo at least 8-10 deg increase in AROM of DF, PF, and Inv for improved stiffness and mobility.   Baseline:  01/25/2021 PROGRESSING  3 Pt will progress with [place] as MD allows without adverse effects for improved gait and mobility Baseline: 01/25/2021 GOAL MET  4 Pt will perform a 6 inch step up with good control and form.  Baseline: 02/22/2021 INITIAL  5 Pt will be able to perform her daily transfers without difficulty. Baseline: 02/22/2021 ONGOING                      LONG TERM GOALS:    LTG Name Target Date Goal status  1 Pt will ambulate with a normalized heel to toe gait pattern without limping.  Baseline: 03/08/2021 ONGOING  2 Pt will ambulate community distance without an AD without significant pain or difficulty.   Baseline: 03/08/2021 PROGRESSING  3 Pt will perform stairs with a reciprocal gait without rail with good control.  Baseline: 03/08/2021 INITIAL  4 Pt will demo at least 10 deg of L ankle DF AROM for improved stiffness and gait.  Baseline: 03/08/2021 PARTIALLY MET  5 Pt will demo 5/5 L ankle strength in DF, Eve, and Inv and WFL for PF (tested in [place]) for improved  tolerance to daily mobility and taking care of kids and to assist in restoring PLOF.  Baseline: 03/08/2021 INITIAL  6 Pt will be able to perform her normal standing activities including household chores without significant pain or difficulty.  Baseline: 03/08/2021 ONGOING             PLAN: FREQUENCY:  2 times per week  DURATION:  5 weeks   PLANNED INTERVENTIONS: Therapeutic exercises, Therapeutic activity, Neuro Muscular re-education, Balance training, Gait training, Patient/Family education, Joint mobilization, Stair training, Aquatic Therapy, Electrical stimulation, Cryotherapy, Taping, and Manual therapy   PLAN FOR NEXT SESSION: Cont with ankle ROM, Gait training, and progressing WB'ing.  Ice as needed to reduce swelling, soreness, and pain.       Selinda Michaels III PT, DPT 02/01/21 1:27 PM

## 2021-02-01 ENCOUNTER — Ambulatory Visit (HOSPITAL_BASED_OUTPATIENT_CLINIC_OR_DEPARTMENT_OTHER): Payer: 59 | Admitting: Physical Therapy

## 2021-02-01 ENCOUNTER — Other Ambulatory Visit: Payer: Self-pay

## 2021-02-01 ENCOUNTER — Encounter (HOSPITAL_BASED_OUTPATIENT_CLINIC_OR_DEPARTMENT_OTHER): Payer: Self-pay | Admitting: Physical Therapy

## 2021-02-01 DIAGNOSIS — M25572 Pain in left ankle and joints of left foot: Secondary | ICD-10-CM

## 2021-02-01 DIAGNOSIS — R262 Difficulty in walking, not elsewhere classified: Secondary | ICD-10-CM

## 2021-02-01 DIAGNOSIS — M6281 Muscle weakness (generalized): Secondary | ICD-10-CM

## 2021-02-01 DIAGNOSIS — M25672 Stiffness of left ankle, not elsewhere classified: Secondary | ICD-10-CM

## 2021-02-03 ENCOUNTER — Encounter (HOSPITAL_BASED_OUTPATIENT_CLINIC_OR_DEPARTMENT_OTHER): Payer: Self-pay | Admitting: Physical Therapy

## 2021-02-03 ENCOUNTER — Ambulatory Visit (HOSPITAL_BASED_OUTPATIENT_CLINIC_OR_DEPARTMENT_OTHER): Payer: 59 | Admitting: Physical Therapy

## 2021-02-03 ENCOUNTER — Other Ambulatory Visit: Payer: Self-pay

## 2021-02-03 DIAGNOSIS — M6281 Muscle weakness (generalized): Secondary | ICD-10-CM

## 2021-02-03 DIAGNOSIS — M25572 Pain in left ankle and joints of left foot: Secondary | ICD-10-CM | POA: Diagnosis not present

## 2021-02-03 DIAGNOSIS — R262 Difficulty in walking, not elsewhere classified: Secondary | ICD-10-CM

## 2021-02-03 DIAGNOSIS — M25672 Stiffness of left ankle, not elsewhere classified: Secondary | ICD-10-CM

## 2021-02-03 NOTE — Therapy (Signed)
OUTPATIENT PHYSICAL THERAPY TREATMENT NOTE/PROGRESS NOTE   Patient Name: Lonni Dirden MRN: [id number] DOB:1975/07/18, 45 y.o., female Today's Date: 02/03/2021  PCP: Orpah Melter, MD  PT End of Session - 02/03/21 1000     Visit Number 11    Number of Visits 20    Date for PT Re-Evaluation 03/08/21    Authorization Type [employer]    PT Start Time 0930    PT Stop Time 1015    PT Time Calculation (min) 45 min    Activity Tolerance Patient tolerated treatment well    Behavior During Therapy [facility] for tasks assessed/performed                 History reviewed. No pertinent past medical history. Past Surgical History:  Procedure Laterality Date   EYE SURGERY     KNEE SURGERY Left    TIBIA IM NAIL INSERTION Left 11/30/2020   Procedure: INTRAMEDULLARY (IM) NAIL TIBIAL AND TIBIAL PLATE;  Surgeon: Vanetta Mulders, MD;  Location: [place];  Service: Orthopedics;  Laterality: Left;   Patient Active Problem List   Diagnosis Date Noted   Left tibial fracture 11/29/2020    REFERRING PROVIDER: Vanetta Mulders, MD   REFERRING DIAG: S82.52XD (ICD-10-CM) - Closed displaced fracture of medial malleolus of left tibia with routine healing, subsequent encounter    THERAPY DIAG:  Pain in left ankle and joints of left foot   Stiffness of left ankle, not elsewhere classified   Muscle weakness (generalized)   Difficulty in walking, not elsewhere classified   ONSET DATE:  Injury 11/29/2020 / Surgery 11/30/2020   SUBJECTIVE:    SUBJECTIVE STATEMENT: -Pt is 9 weeks and 2 days s/p left intramedullary nail of the tibia on 11/30/2020. Pt reports having no increased pain after prior Rx.  Pt presents to session today without crutch.  Pt reports her numbness is improving though continues to have numbness present. Pt has occasional medial distal L LE pain with ambulation.   RESPONSE TO PRIOR RX:  Pt denies any adverse effects after prior Rx.  Pt states she had a little pain in the evening after Rx  which is typical for her.   HEP COMPLIANCE:  Pt reports compliance with HEP. FUNCTIONAL IMPROVEMENTS:  ambulation, standing, stairs, make and carry her own coffee, driving, ROM FUNCTIONAL LIMITATIONS: ambulation, taking care of kids, household chores, stairs   PERTINENT HISTORY: MD notes/orders:   WBAT and Pt states she can remove crutch per MD.  Prior L knee surgeries x 2 (approx 7 years ago)   PAIN:  Are you having pain? Yes NRPS scale: 2/10 currently at rest, 0/10 best, 4/10 worst.  Pt states her upper back is feeling better today though does cont to have a little lumbar pain.  Pain location: anterior ankle and foot, lateral ankle Pain orientation: Left      PRECAUTIONS: Other: per surgery and WB'ing orders per MD.  See below    WEIGHT BEARING RESTRICTIONS Yes; weightbearing as tolerated on the left leg.    PLOF: Independent;  Pt ambulated without an AD.  Pt was able to ambulate community distance and perform all of her normal functional mobility skills independently without limitation.  Pt was able to take care of her kids without difficulty.     PATIENT GOALS:  return to PLOF     OBJECTIVE:    TODAY'S TREATMENT:        -Therapeutic Exercise: -Reviewed response to prior Rx, current function, HEP compliance, and pain levels.   -  Pt received L ankle PROM in DF, PF, Eve, and Inv.  -Pt received L gastroc stretch 2x20-30 sec and soleus stretch 3x20-30 sec -Pt performed:   Seated toe raises and heel raise 2x10 reps each Seated DF/PF and cw and ccw circles on fitter first 2x10 reps each Seated ankle inv/eve with towel 2x10 reps Standing gastroc stretching 2 x 20-30 seconds    -Kinetic Activities: (for improved gait and mobility)  Pt performed   Standing weight shifts on airex s/s x 10 reps and f/b x 15 reps Heel toe rocking x20  Step ups on 4 inch step with UE assist 2x10 reps     PATIENT EDUCATION:  Education details:  POC; instructed pt to cont with HEP; exercise  form.  Person educated: Patient Education method: Explanation, Demonstration, Tactile cues, Verbal cues, handout Education comprehension: verbalized understanding, returned demonstration, verbal cues required, and tactile cues required     HOME EXERCISE PROGRAM: Access Code: [email or web address] URL: [email or web address] Date: 02/01/2021 Prepared by: Ronny Flurry  Exercises ANKLE PUMPS - 2-3 x daily - 7 x weekly - 2 sets - 10 reps Seated Toe Curl - 3 x daily - 7 x weekly - 2-3 sets - 10 reps Long Sitting Calf Stretch with Strap - 2 x daily - 7 x weekly - 2-3 reps - 20 seconds hold Supine Heel Slide - 2 x daily - 7 x weekly - 2 sets - 10 reps Supine Active Straight Leg Raise - 1 x daily - 6-7 x weekly - 2 sets - 10 reps Seated Ankle Alphabet - 2-3 x daily - 7 x weekly - 1 reps Seated Ankle Circles - 2 x daily - 7 x weekly - 2 sets - 10 reps Sidelying Hip Abduction - 1 x daily - 5-7 x weekly - 2 sets - 10 reps Ankle Inversion Eversion Towel Slide - 2 x daily - 7 x weekly - 2 sets - 10 reps Seated Toe Raise - 2 x daily - 7 x weekly - 2 sets - 10 reps Seated Heel Raise - 2 x daily - 7 x weekly - 2 sets - 10 reps      ASSESSMENT:   CLINICAL IMPRESSION: Pt is 9 weeks and 2 days post op and is progressing slowly and steadily in PT.  She continues to have tightness and limitations with L ankle ROM though is making slow progress in all directions.  Pt demonstrates improved DF PROM and has pain at end range.  Pt has removed crutch and is increasing her ambulation without an AD.  Pt also has less pain with gait and has improved tolerance with WB'ing.  She responded well to Rx.  Pt states she has some pain after Rx and reports pain level being no different from the beginning of Rx, 2/10.  Pt should continue to benefit from continued skilled PT to address impairments and goals and to assist with restoring PLOF.        Objective impairments include Abnormal gait, decreased activity  tolerance, decreased endurance, decreased mobility, difficulty walking, decreased ROM, decreased strength, hypomobility, impaired flexibility, and pain. These impairments are limiting patient from cleaning, community activity, meal prep, occupation, laundry, yard work, and shopping.         GOALS:     SHORT TERM GOALS:   STG Name Target Date Goal status  1 Pt will be independent and compliant with HEP for improved pain, ROM, strength, and function.  Baseline:  01/18/2021 GOAL MET  2 Pt will  demo at least 8-10 deg increase in AROM of DF, PF, and Inv for improved stiffness and mobility.   Baseline:  01/25/2021 PROGRESSING  3 Pt will progress with [place] as MD allows without adverse effects for improved gait and mobility Baseline: 01/25/2021 GOAL MET  4 Pt will perform a 6 inch step up with good control and form.  Baseline: 02/22/2021 INITIAL  5 Pt will be able to perform her daily transfers without difficulty. Baseline: 02/22/2021 ONGOING                      LONG TERM GOALS:    LTG Name Target Date Goal status  1 Pt will ambulate with a normalized heel to toe gait pattern without limping.  Baseline: 03/08/2021 ONGOING  2 Pt will ambulate community distance without an AD without significant pain or difficulty.   Baseline: 03/08/2021 PROGRESSING  3 Pt will perform stairs with a reciprocal gait without rail with good control.  Baseline: 03/08/2021 INITIAL  4 Pt will demo at least 10 deg of L ankle DF AROM for improved stiffness and gait.  Baseline: 03/08/2021 PARTIALLY MET  5 Pt will demo 5/5 L ankle strength in DF, Eve, and Inv and WFL for PF (tested in [place]) for improved tolerance to daily mobility and taking care of kids and to assist in restoring PLOF.  Baseline: 03/08/2021 INITIAL  6 Pt will be able to perform her normal standing activities including household chores without significant pain or difficulty.  Baseline: 03/08/2021 ONGOING             PLAN: FREQUENCY:  2 times per  week  DURATION:  5 weeks   PLANNED INTERVENTIONS: Therapeutic exercises, Therapeutic activity, Neuro Muscular re-education, Balance training, Gait training, Patient/Family education, Joint mobilization, Stair training, Aquatic Therapy, Electrical stimulation, Cryotherapy, Taping, and Manual therapy   PLAN FOR NEXT SESSION: Cont with ankle ROM, Gait training, and progressing WB'ing.  Ice as needed to reduce swelling, soreness, and pain.       Selinda Michaels III PT, DPT 02/03/21 2:58 PM

## 2021-02-09 ENCOUNTER — Telehealth (HOSPITAL_BASED_OUTPATIENT_CLINIC_OR_DEPARTMENT_OTHER): Payer: Self-pay | Admitting: Orthopaedic Surgery

## 2021-02-09 ENCOUNTER — Other Ambulatory Visit: Payer: Self-pay

## 2021-02-09 ENCOUNTER — Ambulatory Visit (HOSPITAL_BASED_OUTPATIENT_CLINIC_OR_DEPARTMENT_OTHER): Payer: 59 | Admitting: Physical Therapy

## 2021-02-09 ENCOUNTER — Encounter (HOSPITAL_BASED_OUTPATIENT_CLINIC_OR_DEPARTMENT_OTHER): Payer: Self-pay | Admitting: Physical Therapy

## 2021-02-09 ENCOUNTER — Other Ambulatory Visit (HOSPITAL_BASED_OUTPATIENT_CLINIC_OR_DEPARTMENT_OTHER): Payer: Self-pay | Admitting: Orthopaedic Surgery

## 2021-02-09 DIAGNOSIS — M25572 Pain in left ankle and joints of left foot: Secondary | ICD-10-CM

## 2021-02-09 DIAGNOSIS — S8252XD Displaced fracture of medial malleolus of left tibia, subsequent encounter for closed fracture with routine healing: Secondary | ICD-10-CM

## 2021-02-09 DIAGNOSIS — M6281 Muscle weakness (generalized): Secondary | ICD-10-CM

## 2021-02-09 DIAGNOSIS — M25672 Stiffness of left ankle, not elsewhere classified: Secondary | ICD-10-CM

## 2021-02-09 DIAGNOSIS — R262 Difficulty in walking, not elsewhere classified: Secondary | ICD-10-CM

## 2021-02-09 NOTE — Telephone Encounter (Signed)
Pt called back concerned about wanting an xray before the end of the year to make sure nail is still in place. Added her on 12/28 schedule

## 2021-02-09 NOTE — Therapy (Signed)
OUTPATIENT PHYSICAL THERAPY TREATMENT NOTE  Patient Name: Janet Dalton MRN: [id number] DOB:16-Aug-1975, 45 y.o., female Today's Date: 02/09/2021  PCP: Orpah Melter, MD  PT End of Session - 02/09/21 0819     Visit Number 12    Number of Visits 20    Date for PT Re-Evaluation 03/08/21    Authorization Type [employer]    PT Start Time 0814    PT Stop Time 0847    PT Time Calculation (min) 33 min    Activity Tolerance Patient tolerated treatment well    Behavior During Therapy [facility] for tasks assessed/performed                  History reviewed. No pertinent past medical history. Past Surgical History:  Procedure Laterality Date   EYE SURGERY     KNEE SURGERY Left    TIBIA IM NAIL INSERTION Left 11/30/2020   Procedure: INTRAMEDULLARY (IM) NAIL TIBIAL AND TIBIAL PLATE;  Surgeon: Vanetta Mulders, MD;  Location: [place];  Service: Orthopedics;  Laterality: Left;   Patient Active Problem List   Diagnosis Date Noted   Left tibial fracture 11/29/2020    REFERRING PROVIDER: Vanetta Mulders, MD   REFERRING DIAG: S82.52XD (ICD-10-CM) - Closed displaced fracture of medial malleolus of left tibia with routine healing, subsequent encounter    THERAPY DIAG:  Pain in left ankle and joints of left foot   Stiffness of left ankle, not elsewhere classified   Muscle weakness (generalized)   Difficulty in walking, not elsewhere classified   ONSET DATE:  Injury 11/29/2020 / Surgery 11/30/2020   SUBJECTIVE:    SUBJECTIVE STATEMENT: -Pt is 10 weeks and 1 day s/p left intramedullary nail of the tibia on 11/30/2020. Pt has occasional medial distal L LE pain with ambulation.  Pt states she had 4-5/10 pain in medial L ankle with ambulating today and yesterday.  Pt states she feels the pain more 1st thing in AM.  Pt states she had some pain in lateral ankle also but worse medially. Pt is feeling better today.  Pt reports 2-3/10 pain currently.    RESPONSE TO PRIOR RX:  Pt denies any adverse  effects after prior Rx.  Pt states she had a little pain in the evening after Rx which is typical for her.   HEP COMPLIANCE:  Pt reports compliance with HEP. FUNCTIONAL IMPROVEMENTS:  ambulation, standing, stairs, make and carry her own coffee, driving, ROM FUNCTIONAL LIMITATIONS: ambulation, taking care of kids, household chores, stairs   PERTINENT HISTORY: MD notes/orders:   WBAT and Pt states she can remove crutch per MD.  Prior L knee surgeries x 2 (approx 7 years ago)   PAIN:  Are you having pain? Yes NRPS scale: 2/10 currently at rest, 0/10 best, 4/10 worst.  Pt states her upper back is feeling better today though does cont to have a little lumbar pain.  Pain location: anterior ankle and foot, lateral ankle Pain orientation: Left      PRECAUTIONS: Other: per surgery and WB'ing orders per MD.  See below    WEIGHT BEARING RESTRICTIONS Yes; weightbearing as tolerated on the left leg.    PLOF: Independent;  Pt ambulated without an AD.  Pt was able to ambulate community distance and perform all of her normal functional mobility skills independently without limitation.  Pt was able to take care of her kids without difficulty.     PATIENT GOALS:  return to PLOF     OBJECTIVE:    TODAY'S TREATMENT:        -  Therapeutic Exercise: -Reviewed response to prior Rx, current function, HEP compliance, and pain levels.   -Pt received L ankle PROM in DF, PF, Eve, and Inv.  -Pt received L manual gastroc stretch 2x20-30 sec and manual soleus stretch 3x20-30 sec -Pt performed:   Seated toe raises and heel raise 2x10 reps each    -Kinetic Activities: (for improved gait and mobility)  Pt performed   Low level march on airex 2 x 10 reps Heel toe rocking x20  Step ups on 4 inch step with UE assist 2x10 reps Lateral step ups on 4 inch step 2x10 reps     PATIENT EDUCATION:  Education details:  Answered Pt's questions. instructed pt to cont with HEP; exercise form.  Person educated:  Patient Education method: Explanation, Demonstration, Tactile cues, Verbal cues, handout Education comprehension: verbalized understanding, returned demonstration, verbal cues required, and tactile cues required     HOME EXERCISE PROGRAM: Access Code: [email or web address] URL: [email or web address] Date: 02/01/2021 Prepared by: Ronny Flurry  Exercises ANKLE PUMPS - 2-3 x daily - 7 x weekly - 2 sets - 10 reps Seated Toe Curl - 3 x daily - 7 x weekly - 2-3 sets - 10 reps Long Sitting Calf Stretch with Strap - 2 x daily - 7 x weekly - 2-3 reps - 20 seconds hold Supine Heel Slide - 2 x daily - 7 x weekly - 2 sets - 10 reps Supine Active Straight Leg Raise - 1 x daily - 6-7 x weekly - 2 sets - 10 reps Seated Ankle Alphabet - 2-3 x daily - 7 x weekly - 1 reps Seated Ankle Circles - 2 x daily - 7 x weekly - 2 sets - 10 reps Sidelying Hip Abduction - 1 x daily - 5-7 x weekly - 2 sets - 10 reps Ankle Inversion Eversion Towel Slide - 2 x daily - 7 x weekly - 2 sets - 10 reps Seated Toe Raise - 2 x daily - 7 x weekly - 2 sets - 10 reps Seated Heel Raise - 2 x daily - 7 x weekly - 2 sets - 10 reps      ASSESSMENT:   CLINICAL IMPRESSION: Pt continues to have tightness and limitations with L ankle ROM though is making progress.  She seems to be tighter in PF and inversion though demonstrates improved DF.  Pt is ambulating without AD and reports having intermittent medial ankle pain.  PT is progressing Wb'ing and Pt performed exercises well.  She responded well to Rx having no c/o's after Rx.  Pt should continue to benefit from continued skilled PT to address impairments and goals and to assist with restoring PLOF.        Objective impairments include Abnormal gait, decreased activity tolerance, decreased endurance, decreased mobility, difficulty walking, decreased ROM, decreased strength, hypomobility, impaired flexibility, and pain. These impairments are limiting patient from cleaning,  community activity, meal prep, occupation, laundry, yard work, and shopping.         GOALS:     SHORT TERM GOALS:   STG Name Target Date Goal status  1 Pt will be independent and compliant with HEP for improved pain, ROM, strength, and function.  Baseline:  01/18/2021 GOAL MET  2 Pt will demo at least 8-10 deg increase in AROM of DF, PF, and Inv for improved stiffness and mobility.   Baseline:  01/25/2021 PROGRESSING  3 Pt will progress with [place] as MD allows without adverse effects for improved gait and mobility Baseline: 01/25/2021  GOAL MET  4 Pt will perform a 6 inch step up with good control and form.  Baseline: 02/22/2021 INITIAL  5 Pt will be able to perform her daily transfers without difficulty. Baseline: 02/22/2021 ONGOING                      LONG TERM GOALS:    LTG Name Target Date Goal status  1 Pt will ambulate with a normalized heel to toe gait pattern without limping.  Baseline: 03/08/2021 ONGOING  2 Pt will ambulate community distance without an AD without significant pain or difficulty.   Baseline: 03/08/2021 PROGRESSING  3 Pt will perform stairs with a reciprocal gait without rail with good control.  Baseline: 03/08/2021 INITIAL  4 Pt will demo at least 10 deg of L ankle DF AROM for improved stiffness and gait.  Baseline: 03/08/2021 PARTIALLY MET  5 Pt will demo 5/5 L ankle strength in DF, Eve, and Inv and WFL for PF (tested in [place]) for improved tolerance to daily mobility and taking care of kids and to assist in restoring PLOF.  Baseline: 03/08/2021 INITIAL  6 Pt will be able to perform her normal standing activities including household chores without significant pain or difficulty.  Baseline: 03/08/2021 ONGOING             PLAN: FREQUENCY:  2 times per week  DURATION:  5 weeks   PLANNED INTERVENTIONS: Therapeutic exercises, Therapeutic activity, Neuro Muscular re-education, Balance training, Gait training, Patient/Family education, Joint mobilization,  Stair training, Aquatic Therapy, Electrical stimulation, Cryotherapy, Taping, and Manual therapy   PLAN FOR NEXT SESSION: Cont with ankle ROM, Gait training, and progressing WB'ing.  Ice as needed to reduce swelling, soreness, and pain.       Selinda Michaels III PT, DPT 02/09/21 9:46 PM

## 2021-02-10 ENCOUNTER — Ambulatory Visit (HOSPITAL_BASED_OUTPATIENT_CLINIC_OR_DEPARTMENT_OTHER)
Admission: RE | Admit: 2021-02-10 | Discharge: 2021-02-10 | Disposition: A | Discharge status: Home / Self Care | Payer: 59 | Source: Ambulatory Visit | Attending: Orthopaedic Surgery | Admitting: Orthopaedic Surgery

## 2021-02-10 ENCOUNTER — Encounter (HOSPITAL_BASED_OUTPATIENT_CLINIC_OR_DEPARTMENT_OTHER): Payer: Self-pay

## 2021-02-10 ENCOUNTER — Encounter (HOSPITAL_BASED_OUTPATIENT_CLINIC_OR_DEPARTMENT_OTHER): Payer: 59 | Admitting: Orthopaedic Surgery

## 2021-02-10 DIAGNOSIS — S8252XD Displaced fracture of medial malleolus of left tibia, subsequent encounter for closed fracture with routine healing: Secondary | ICD-10-CM | POA: Diagnosis not present

## 2021-02-11 ENCOUNTER — Telehealth (HOSPITAL_BASED_OUTPATIENT_CLINIC_OR_DEPARTMENT_OTHER): Payer: Self-pay | Admitting: Orthopaedic Surgery

## 2021-02-11 ENCOUNTER — Ambulatory Visit (HOSPITAL_BASED_OUTPATIENT_CLINIC_OR_DEPARTMENT_OTHER): Payer: 59 | Admitting: Physical Therapy

## 2021-02-11 ENCOUNTER — Encounter (HOSPITAL_BASED_OUTPATIENT_CLINIC_OR_DEPARTMENT_OTHER): Payer: Self-pay | Admitting: Physical Therapy

## 2021-02-11 ENCOUNTER — Other Ambulatory Visit: Payer: Self-pay

## 2021-02-11 DIAGNOSIS — M25672 Stiffness of left ankle, not elsewhere classified: Secondary | ICD-10-CM

## 2021-02-11 DIAGNOSIS — M25572 Pain in left ankle and joints of left foot: Secondary | ICD-10-CM | POA: Diagnosis not present

## 2021-02-11 DIAGNOSIS — M6281 Muscle weakness (generalized): Secondary | ICD-10-CM

## 2021-02-11 DIAGNOSIS — R262 Difficulty in walking, not elsewhere classified: Secondary | ICD-10-CM

## 2021-02-11 NOTE — Therapy (Addendum)
OUTPATIENT PHYSICAL THERAPY TREATMENT NOTE  Patient Name: Janet Dalton MRN: [id number] DOB:27-Jan-1976, 45 y.o., female Today's Date: 02/12/2021  PCP: Orpah Melter, MD  PT End of Session - 02/11/21 0941     Visit Number 13    Number of Visits 20    Date for PT Re-Evaluation 03/08/21    Authorization Type U[employer]    PT Start Time 0853    PT Stop Time 0934    PT Time Calculation (min) 41 min    Activity Tolerance Patient tolerated treatment well    Behavior During Therapy [facility] for tasks assessed/performed                   History reviewed. No pertinent past medical history. Past Surgical History:  Procedure Laterality Date   EYE SURGERY     KNEE SURGERY Left    TIBIA IM NAIL INSERTION Left 11/30/2020   Procedure: INTRAMEDULLARY (IM) NAIL TIBIAL AND TIBIAL PLATE;  Surgeon: Vanetta Mulders, MD;  Location: [place];  Service: Orthopedics;  Laterality: Left;   Patient Active Problem List   Diagnosis Date Noted   Left tibial fracture 11/29/2020    REFERRING PROVIDER: Vanetta Mulders, MD   REFERRING DIAG: S82.52XD (ICD-10-CM) - Closed displaced fracture of medial malleolus of left tibia with routine healing, subsequent encounter    THERAPY DIAG:  Pain in left ankle and joints of left foot   Stiffness of left ankle, not elsewhere classified   Muscle weakness (generalized)   Difficulty in walking, not elsewhere classified   ONSET DATE:  Injury 11/29/2020 / Surgery 11/30/2020   SUBJECTIVE:    SUBJECTIVE STATEMENT: -Pt is 10 weeks and 3 days s/p left intramedullary nail of the tibia on 11/30/2020. Pt has occasional medial distal L LE pain with ambulation.  Pt states she feels the pain more 1st thing in AM.  Pt states she had some pain in lateral ankle also but worse medially.  Pt had an x ray and spoke with ATC yesterday.  Pt reports MD had to rush out to perform a surgery and was unable to see him.  Pt is concerned about her high co-pay for PT and needs to reduce PT  appt's.  Pt states her back still hurts.   RESPONSE TO PRIOR RX:  Pt denies any adverse effects after prior Rx.  Pt states she had mild soreness after Rx.   HEP COMPLIANCE:  Pt reports compliance with HEP. FUNCTIONAL IMPROVEMENTS:  ambulation, standing, stairs, make and carry her own coffee, driving, ROM FUNCTIONAL LIMITATIONS: ambulation, taking care of kids, household chores, stairs   PERTINENT HISTORY: MD notes/orders:   WBAT and Pt states she can remove crutch per MD.  Prior L knee surgeries x 2 (approx 7 years ago)   PAIN:  Are you having pain? Yes NRPS scale: 1-2/10 currently at rest, 0/10 best, 4/10 worst.  Pt states her upper back is feeling better today though does cont to have a little lumbar pain.  Pain location: anterior ankle and foot, lateral ankle Pain orientation: Left      PRECAUTIONS: Other: per surgery and WB'ing orders per MD.  See below    WEIGHT BEARING RESTRICTIONS Yes; weightbearing as tolerated on the left leg.    PLOF: Independent;  Pt ambulated without an AD.  Pt was able to ambulate community distance and perform all of her normal functional mobility skills independently without limitation.  Pt was able to take care of her kids without difficulty.  PATIENT GOALS:  return to PLOF     OBJECTIVE:    TODAY'S TREATMENT:        -Therapeutic Exercise: -Reviewed response to prior Rx, current function, HEP compliance, and pain levels.   -Pt received L ankle PROM in DF, PF, Eve, and Inv.  -Pt performed:   Seated toe raises and heel raise 2x10 reps each Seated wobble board 2x10 reps f/b, cw, and ccw Seated eve/inv on towel 2x10 reps Standing gastroc stretch at wall 3x20-30 sec   -Kinetic Activities: (for improved gait and mobility)  Pt performed   Low level march on airex 2 x 10 reps  Step ups on 6 inch step with UE assist 2x10 reps Lateral step ups on 4 inch step 2x10 reps     PATIENT EDUCATION:  Education details:  Answered Pt's questions.  instructed pt to cont with HEP; exercise form.  Person educated: Patient Education method: Explanation, Demonstration, Tactile cues, Verbal cues, handout Education comprehension: verbalized understanding, returned demonstration, verbal cues required, and tactile cues required     HOME EXERCISE PROGRAM: Access Code: [email or web address] URL: [email or web address] Date: 02/11/2021 Prepared by: Ronny Flurry  Exercises ANKLE PUMPS - 2-3 x daily - 7 x weekly - 2 sets - 10 reps Seated Toe Curl - 3 x daily - 7 x weekly - 2-3 sets - 10 reps Long Sitting Calf Stretch with Strap - 2 x daily - 7 x weekly - 2-3 reps - 20 seconds hold Supine Heel Slide - 2 x daily - 7 x weekly - 2 sets - 10 reps Supine Active Straight Leg Raise - 1 x daily - 6-7 x weekly - 2 sets - 10 reps Seated Ankle Alphabet - 2-3 x daily - 7 x weekly - 1 reps Seated Ankle Circles - 2 x daily - 7 x weekly - 2 sets - 10 reps Sidelying Hip Abduction - 1 x daily - 5-7 x weekly - 2 sets - 10 reps Ankle Inversion Eversion Towel Slide - 2 x daily - 7 x weekly - 2 sets - 10 reps Seated Toe Raise - 2 x daily - 7 x weekly - 2 sets - 10 reps Seated Heel Raise - 2 x daily - 7 x weekly - 2 sets - 10 reps Gastroc Stretch on Wall - 2 x daily - 7 x weekly - 2 reps - 20-30 seconds hold      ASSESSMENT:   CLINICAL IMPRESSION: Pt is progressing slowly but steadily in function, PT, and toward goals.  She continues to have tightness and limitations with L ankle ROM though is making progress.  Pt has improved with DF ROM having reduced tightness and has pain at end range.  Pt is ambulating without AD and reports having intermittent medial ankle pain.  She has not had the pain this week like she had on Sunday and Monday.  Pt is progressing with Wb'ing and gait.   She responded well to Rx having no c/o's after Rx.  Pt should continue to benefit from continued skilled PT to address impairments and goals and to assist with restoring PLOF.         Objective impairments include Abnormal gait, decreased activity tolerance, decreased endurance, decreased mobility, difficulty walking, decreased ROM, decreased strength, hypomobility, impaired flexibility, and pain. These impairments are limiting patient from cleaning, community activity, meal prep, occupation, laundry, yard work, and shopping.         GOALS:     SHORT TERM GOALS:   STG Name  Target Date Goal status  1 Pt will be independent and compliant with HEP for improved pain, ROM, strength, and function.  Baseline:  01/18/2021 GOAL MET  2 Pt will demo at least 8-10 deg increase in AROM of DF, PF, and Inv for improved stiffness and mobility.   Baseline:  01/25/2021 PROGRESSING  3 Pt will progress with [place] as MD allows without adverse effects for improved gait and mobility Baseline: 01/25/2021 GOAL MET  4 Pt will perform a 6 inch step up with good control and form.  Baseline: 02/22/2021 INITIAL  5 Pt will be able to perform her daily transfers without difficulty. Baseline: 02/22/2021 ONGOING                      LONG TERM GOALS:    LTG Name Target Date Goal status  1 Pt will ambulate with a normalized heel to toe gait pattern without limping.  Baseline: 03/08/2021 ONGOING  2 Pt will ambulate community distance without an AD without significant pain or difficulty.   Baseline: 03/08/2021 PROGRESSING  3 Pt will perform stairs with a reciprocal gait without rail with good control.  Baseline: 03/08/2021 INITIAL  4 Pt will demo at least 10 deg of L ankle DF AROM for improved stiffness and gait.  Baseline: 03/08/2021 PARTIALLY MET  5 Pt will demo 5/5 L ankle strength in DF, Eve, and Inv and WFL for PF (tested in [place]) for improved tolerance to daily mobility and taking care of kids and to assist in restoring PLOF.  Baseline: 03/08/2021 INITIAL  6 Pt will be able to perform her normal standing activities including household chores without significant pain or difficulty.  Baseline:  03/08/2021 ONGOING             PLAN:  PLANNED INTERVENTIONS: Therapeutic exercises, Therapeutic activity, Neuro Muscular re-education, Balance training, Gait training, Patient/Family education, Joint mobilization, Stair training, Aquatic Therapy, Electrical stimulation, Cryotherapy, Taping, and Manual therapy   PLAN FOR NEXT SESSION: Pt has a high co-pay and will reduce frequency of appt's.  Pt will plan for 1x/wk and progression to once every other week. Cont with ankle ROM, Gait training, and progressing WB'ing.  Ice as needed to reduce swelling, soreness, and pain.       Selinda Michaels III PT, DPT 02/12/21 7:41 AM

## 2021-02-11 NOTE — Telephone Encounter (Signed)
Spoke to patient directly. Pt was concerned of financial strain for PT starting the new year. Per Dr. Steward Drone, patient can limit PT visits to once every other week or once a month until she is reevaluated for her follow up appt in February. Patient requested xray to be performed 12/28 and I informed her Dr. Steward Drone said the Post Op hardware on films looked good and unchanged.

## 2021-02-16 ENCOUNTER — Encounter (HOSPITAL_BASED_OUTPATIENT_CLINIC_OR_DEPARTMENT_OTHER): Payer: 59 | Admitting: Physical Therapy

## 2021-02-18 ENCOUNTER — Other Ambulatory Visit: Payer: Self-pay

## 2021-02-18 ENCOUNTER — Ambulatory Visit (HOSPITAL_BASED_OUTPATIENT_CLINIC_OR_DEPARTMENT_OTHER): Payer: 59 | Attending: Orthopaedic Surgery | Admitting: Physical Therapy

## 2021-02-18 ENCOUNTER — Encounter (HOSPITAL_BASED_OUTPATIENT_CLINIC_OR_DEPARTMENT_OTHER): Payer: Self-pay | Admitting: Physical Therapy

## 2021-02-18 DIAGNOSIS — M25672 Stiffness of left ankle, not elsewhere classified: Secondary | ICD-10-CM | POA: Insufficient documentation

## 2021-02-18 DIAGNOSIS — R262 Difficulty in walking, not elsewhere classified: Secondary | ICD-10-CM | POA: Insufficient documentation

## 2021-02-18 DIAGNOSIS — M6281 Muscle weakness (generalized): Secondary | ICD-10-CM | POA: Insufficient documentation

## 2021-02-18 DIAGNOSIS — M25572 Pain in left ankle and joints of left foot: Secondary | ICD-10-CM | POA: Diagnosis not present

## 2021-02-18 NOTE — Therapy (Signed)
OUTPATIENT PHYSICAL THERAPY TREATMENT NOTE  Patient Name: Janet Dalton MRN: [id number] DOB:1975-09-01, 46 y.o., female Today's Date: 02/18/2021  PCP: Orpah Melter, MD  PT End of Session - 02/18/21 1035     Visit Number 14    Number of Visits 20    Date for PT Re-Evaluation 03/08/21    Authorization Type [employer]    PT Start Time 3007    PT Stop Time 1106    PT Time Calculation (min) 43 min    Activity Tolerance Patient tolerated treatment well    Behavior During Therapy [facility] for tasks assessed/performed                    History reviewed. No pertinent past medical history. Past Surgical History:  Procedure Laterality Date   EYE SURGERY     KNEE SURGERY Left    TIBIA IM NAIL INSERTION Left 11/30/2020   Procedure: INTRAMEDULLARY (IM) NAIL TIBIAL AND TIBIAL PLATE;  Surgeon: Vanetta Mulders, MD;  Location: [place];  Service: Orthopedics;  Laterality: Left;   Patient Active Problem List   Diagnosis Date Noted   Left tibial fracture 11/29/2020    REFERRING PROVIDER: Vanetta Mulders, MD   REFERRING DIAG: S82.52XD (ICD-10-CM) - Closed displaced fracture of medial malleolus of left tibia with routine healing, subsequent encounter    THERAPY DIAG:  Pain in left ankle and joints of left foot   Stiffness of left ankle, not elsewhere classified   Muscle weakness (generalized)   Difficulty in walking, not elsewhere classified   ONSET DATE:  Injury 11/29/2020 / Surgery 11/30/2020   SUBJECTIVE:    SUBJECTIVE STATEMENT: -Pt is 11 weeks and 3 days s/p left intramedullary nail of the tibia on 11/30/2020. Pt is concerned about her high co-pay for PT and needs to reduce PT appt's.  Pt reports she is not really having the medial ankle and distal LE pain currently though is having lateral ankle pain occasionally with ambulation.  Pt reports occasional 2-3/10 pain with ambulation.  Pt reports improved ankle mobility.  Pt spoke with ATC and was informed that her x rays have not  changed since prior x ray and she can perform PT 1x/wk or once every other week per PT discretion.    RESPONSE TO PRIOR RX:  Pt denies any adverse effects after prior Rx.  HEP COMPLIANCE:  Pt reports compliance with HEP. FUNCTIONAL IMPROVEMENTS:  ambulation, standing, stairs, make and carry her own coffee, driving, ROM FUNCTIONAL LIMITATIONS: ambulation, taking care of kids, household chores, stairs   PERTINENT HISTORY: MD notes/orders:   WBAT and Pt states she can remove crutch per MD.  Prior L knee surgeries x 2 (approx 7 years ago)   PAIN:  Are you having pain? Yes NRPS scale: 2-3/10 currently at rest, 0/10 best, 4/10 worst.  Pt states her upper back is feeling better today though does cont to have a little lumbar pain.  Pain location:  lateral ankle and distal lateral LE Pain orientation: Left      PRECAUTIONS: Other: per surgery and WB'ing orders per MD.  See below    WEIGHT BEARING RESTRICTIONS Yes; weightbearing as tolerated on the left leg.    PLOF: Independent;  Pt ambulated without an AD.  Pt was able to ambulate community distance and perform all of her normal functional mobility skills independently without limitation.  Pt was able to take care of her kids without difficulty.     PATIENT GOALS:  return to PLOF  OBJECTIVE:    TODAY'S TREATMENT:       Ankle AROM:  R/L   DF:  12/7 deg   PF: 60 deg/44 deg  -Therapeutic Exercise: -Reviewed response to prior Rx, current function, HEP compliance, and pain levels.   -Pt received L ankle PROM in DF, PF, Eve, and Inv.  -Assessed ankle AROM -Pt performed:   Seated wobble board 2x10 reps f/b, cw, and ccw Seated eve/inv on towel 2x10 reps Standing gastroc stretch at wall 2x20-30 sec -See pt education below   -Kinetic Activities: (for improved gait and mobility)  Pt performed   Low level march on airex 2 x 10 reps  Step ups on 6 inch step with UE assist 2x10 reps Lateral step ups on 4 inch step 2x10 reps Mini  squats to table 2x10 reps Tandem gait with occasional UE assist x 6 reps     PATIENT EDUCATION:  Education details:  Answered Pt's questions. instructed pt to cont with HEP; exercise form including verbal/visual/tactile cuing for knee alignment with mini squats to table.  Instructed pt in heel to toe gait training and encouraged pt to practice with her everyday walking Person educated: Patient Education method: Explanation, Demonstration, Tactile cues, Verbal cues, handout Education comprehension: verbalized understanding, returned demonstration, verbal cues required, and tactile cues required     HOME EXERCISE PROGRAM: Access Code: [email or web address] URL: [email or web address] Date: 02/11/2021 Prepared by: Ronny Flurry  Exercises ANKLE PUMPS - 2-3 x daily - 7 x weekly - 2 sets - 10 reps Seated Toe Curl - 3 x daily - 7 x weekly - 2-3 sets - 10 reps Long Sitting Calf Stretch with Strap - 2 x daily - 7 x weekly - 2-3 reps - 20 seconds hold Supine Heel Slide - 2 x daily - 7 x weekly - 2 sets - 10 reps Supine Active Straight Leg Raise - 1 x daily - 6-7 x weekly - 2 sets - 10 reps Seated Ankle Alphabet - 2-3 x daily - 7 x weekly - 1 reps Seated Ankle Circles - 2 x daily - 7 x weekly - 2 sets - 10 reps Sidelying Hip Abduction - 1 x daily - 5-7 x weekly - 2 sets - 10 reps Ankle Inversion Eversion Towel Slide - 2 x daily - 7 x weekly - 2 sets - 10 reps Seated Toe Raise - 2 x daily - 7 x weekly - 2 sets - 10 reps Seated Heel Raise - 2 x daily - 7 x weekly - 2 sets - 10 reps Gastroc Stretch on Wall - 2 x daily - 7 x weekly - 2 reps - 20-30 seconds hold      ASSESSMENT:   CLINICAL IMPRESSION: Pt is progressing slowly but steadily in function, ROM, mobility, and toward goals.  PT is slowly increasing Wb'ing exercises.  She is improving with gait though continues to favor L LE having decreased Wb'ing thru L LE.  Pt is improving with mobility of ankle having reduced tightness with PROM.   Pt has a 5/16 deg difference in DF/PF AROM in L LE compared to R LE.  Pt reports improved medial ankle/distal LE pain with ambulation though is experiencing occasional lateral ankle/distal LE pain with ambulation now.  She responded well to Rx having no c/o's after Rx.  Pt should continue to benefit from continued skilled PT to address impairments and goals and to assist with restoring PLOF.        Objective impairments include Abnormal gait, decreased  activity tolerance, decreased endurance, decreased mobility, difficulty walking, decreased ROM, decreased strength, hypomobility, impaired flexibility, and pain. These impairments are limiting patient from cleaning, community activity, meal prep, occupation, laundry, yard work, and shopping.         GOALS:     SHORT TERM GOALS:   STG Name Target Date Goal status  1 Pt will be independent and compliant with HEP for improved pain, ROM, strength, and function.  Baseline:  01/18/2021 GOAL MET  2 Pt will demo at least 8-10 deg increase in AROM of DF, PF, and Inv for improved stiffness and mobility.   Baseline:  01/25/2021 PROGRESSING  3 Pt will progress with [place] as MD allows without adverse effects for improved gait and mobility Baseline: 01/25/2021 GOAL MET  4 Pt will perform a 6 inch step up with good control and form.  Baseline: 02/22/2021 INITIAL  5 Pt will be able to perform her daily transfers without difficulty. Baseline: 02/22/2021 ONGOING                      LONG TERM GOALS:    LTG Name Target Date Goal status  1 Pt will ambulate with a normalized heel to toe gait pattern without limping.  Baseline: 03/08/2021 ONGOING  2 Pt will ambulate community distance without an AD without significant pain or difficulty.   Baseline: 03/08/2021 PROGRESSING  3 Pt will perform stairs with a reciprocal gait without rail with good control.  Baseline: 03/08/2021 INITIAL  4 Pt will demo at least 10 deg of L ankle DF AROM for improved stiffness and  gait.  Baseline: 03/08/2021 PARTIALLY MET  5 Pt will demo 5/5 L ankle strength in DF, Eve, and Inv and WFL for PF (tested in [place]) for improved tolerance to daily mobility and taking care of kids and to assist in restoring PLOF.  Baseline: 03/08/2021 INITIAL  6 Pt will be able to perform her normal standing activities including household chores without significant pain or difficulty.  Baseline: 03/08/2021 ONGOING             PLAN:  PLANNED INTERVENTIONS: Therapeutic exercises, Therapeutic activity, Neuro Muscular re-education, Balance training, Gait training, Patient/Family education, Joint mobilization, Stair training, Aquatic Therapy, Electrical stimulation, Cryotherapy, Taping, and Manual therapy   PLAN FOR NEXT SESSION: Pt has a high co-pay and will reduce frequency of appt's.  Pt to cont 1x/wk with progression to once every other week. Cont with ankle ROM, Gait training, and progressing WB'ing.  Ice as needed to reduce swelling, soreness, and pain.       Selinda Michaels III PT, DPT 02/18/21 11:38 AM

## 2021-02-24 ENCOUNTER — Encounter (HOSPITAL_BASED_OUTPATIENT_CLINIC_OR_DEPARTMENT_OTHER): Payer: 59 | Admitting: Physical Therapy

## 2021-02-26 ENCOUNTER — Encounter (HOSPITAL_BASED_OUTPATIENT_CLINIC_OR_DEPARTMENT_OTHER): Payer: Self-pay | Admitting: Physical Therapy

## 2021-02-26 ENCOUNTER — Other Ambulatory Visit: Payer: Self-pay

## 2021-02-26 ENCOUNTER — Ambulatory Visit (HOSPITAL_BASED_OUTPATIENT_CLINIC_OR_DEPARTMENT_OTHER): Payer: 59 | Admitting: Physical Therapy

## 2021-02-26 DIAGNOSIS — R262 Difficulty in walking, not elsewhere classified: Secondary | ICD-10-CM

## 2021-02-26 DIAGNOSIS — M6281 Muscle weakness (generalized): Secondary | ICD-10-CM

## 2021-02-26 DIAGNOSIS — M25572 Pain in left ankle and joints of left foot: Secondary | ICD-10-CM

## 2021-02-26 DIAGNOSIS — M25672 Stiffness of left ankle, not elsewhere classified: Secondary | ICD-10-CM

## 2021-02-26 NOTE — Therapy (Signed)
OUTPATIENT PHYSICAL THERAPY TREATMENT NOTE  Patient Name: Janet Dalton MRN: [id number] DOB:1975/03/03, 46 y.o., female Today's Date: 02/26/2021  PCP: Orpah Melter, MD  PT End of Session - 02/26/21 0940     Visit Number 15    Number of Visits 20    Date for PT Re-Evaluation 03/08/21    Authorization Type [employer]    PT Start Time 0934    PT Stop Time 1015    PT Time Calculation (min) 41 min    Activity Tolerance Patient tolerated treatment well    Behavior During Therapy [facility] for tasks assessed/performed                    History reviewed. No pertinent past medical history. Past Surgical History:  Procedure Laterality Date   EYE SURGERY     KNEE SURGERY Left    TIBIA IM NAIL INSERTION Left 11/30/2020   Procedure: INTRAMEDULLARY (IM) NAIL TIBIAL AND TIBIAL PLATE;  Surgeon: Vanetta Mulders, MD;  Location: [place];  Service: Orthopedics;  Laterality: Left;   Patient Active Problem List   Diagnosis Date Noted   Left tibial fracture 11/29/2020    REFERRING PROVIDER: Vanetta Mulders, MD   REFERRING DIAG: S82.52XD (ICD-10-CM) - Closed displaced fracture of medial malleolus of left tibia with routine healing, subsequent encounter    THERAPY DIAG:  Pain in left ankle and joints of left foot   Stiffness of left ankle, not elsewhere classified   Muscle weakness (generalized)   Difficulty in walking, not elsewhere classified   ONSET DATE:  Injury 11/29/2020 / Surgery 11/30/2020   SUBJECTIVE:    SUBJECTIVE STATEMENT: -Pt is 12 weeks and 4 days s/p left intramedullary nail of the tibia on 11/30/2020. Pt reports improved numbness.  Pt doesn't have pain when ambulating slowly but has 1-2/10 pain with ambulating faster.  PT spoke with MD and MD states pt is fine to perform ankle resisted exercises.    RESPONSE TO PRIOR RX:  Pt denies any adverse effects after prior Rx.  HEP COMPLIANCE:  Pt reports compliance with HEP. FUNCTIONAL IMPROVEMENTS:  ambulation, standing,  stairs, make and carry her own coffee, driving, ROM FUNCTIONAL LIMITATIONS: ambulation, taking care of kids, household chores, descending stairs   PERTINENT HISTORY: MD notes/orders:   WBAT.  Prior L knee surgeries x 2 (approx 7 years ago)   PAIN:  Are you having pain? Yes NRPS scale: 0/10 currently at rest, 0/10 best, 4/10 worst.  Pt states her upper back is feeling better today though does cont to have a little lumbar pain.  Pain location:  lateral ankle and distal lateral LE Pain orientation: Left      PRECAUTIONS: Other: per surgery and WB'ing orders per MD.  See below    WEIGHT BEARING RESTRICTIONS Yes; weightbearing as tolerated on the left leg.    PLOF: Independent;  Pt ambulated without an AD.  Pt was able to ambulate community distance and perform all of her normal functional mobility skills independently without limitation.  Pt was able to take care of her kids without difficulty.     PATIENT GOALS:  return to PLOF     OBJECTIVE:    TODAY'S TREATMENT:      -Therapeutic Exercise: -Reviewed response to prior Rx, current function, HEP compliance, and pain levels.   -Pt received L ankle PROM in DF, PF, Eve, and Inv.  -Assessed ankle AROM -Pt performed:   Bike x 5 mins Seated wobble board 2x10 reps f/b, cw, and  ccw Ankle DF with YTB x 12 reps and RTB 2x10 reps, PF with RTB 2x10 reps and GTB x 10 reps Seated submaximal isometrics in inv and eve x 10 reps with 5 sec hold -See pt education below -Updated HEP and gave pt a HEP Handout.   -Kinetic Activities: (for improved gait and mobility)  Pt performed   Low level march on airex 2 x 10 reps  Step ups on 6 inch step with UE assist 2x10 reps Lateral step ups on 6 inch step 2x10 reps Mini squats with bilat UE assist 2x10 reps      PATIENT EDUCATION:  Education details:  Updated HEP and gave pt a HEP Handout consisting of resisted DF and PF.  Instructed pt she should not have pain with exercises.  Answered Pt's  questions. instructed pt to cont with HEP; exercise form including verbal/visual/tactile cuing for knee alignment with mini squats.   Person educated: Patient Education method: Explanation, Demonstration, Tactile cues, Verbal cues, handout Education comprehension: verbalized understanding, returned demonstration, verbal cues required, and tactile cues required     HOME EXERCISE PROGRAM: Access Code: [email or web address] URL: [email or web address] Date: 02/26/2021 Prepared by: Ronny Flurry  Exercises ANKLE PUMPS - 2-3 x daily - 7 x weekly - 2 sets - 10 reps Seated Toe Curl - 3 x daily - 7 x weekly - 2-3 sets - 10 reps Long Sitting Calf Stretch with Strap - 2 x daily - 7 x weekly - 2-3 reps - 20 seconds hold Supine Heel Slide - 2 x daily - 7 x weekly - 2 sets - 10 reps Supine Active Straight Leg Raise - 1 x daily - 6-7 x weekly - 2 sets - 10 reps Seated Ankle Alphabet - 2-3 x daily - 7 x weekly - 1 reps Seated Ankle Circles - 2 x daily - 7 x weekly - 2 sets - 10 reps Sidelying Hip Abduction - 1 x daily - 5-7 x weekly - 2 sets - 10 reps Ankle Inversion Eversion Towel Slide - 2 x daily - 7 x weekly - 2 sets - 10 reps Seated Toe Raise - 2 x daily - 7 x weekly - 2 sets - 10 reps Seated Heel Raise - 2 x daily - 7 x weekly - 2 sets - 10 reps Gastroc Stretch on Wall - 2 x daily - 7 x weekly - 2 reps - 20-30 seconds hold Long Sitting Ankle Dorsiflexion with Anchored Resistance - 1 x daily - 3-4 x weekly - 2-3 sets - 10 reps Ankle and Toe Plantarflexion with Resistance - 1 x daily - 3-4 x weekly - 2-3 sets - 10 reps       ASSESSMENT:   CLINICAL IMPRESSION: Pt continues to have a limp with gait and is progressing slowly with quality of gait.  Pt has improved DF and PF ROM as evidenced by performance of exercises.  Pt was able to perform DF and PF with T band well  She was unable to perform resisted Inv and had much difficulty with resisted eversion.  Pt did tolerate manually resisted  isometrics in Inv and Eve well.  Pt is progressing with gait having improved pain with ambulation.  She is improving with closed chain activities in the clinic.  She responded well to Rx having no c/o's after Rx.  Pt should continue to benefit from continued skilled PT to address impairments and goals and to assist with restoring PLOF.        Objective impairments  include Abnormal gait, decreased activity tolerance, decreased endurance, decreased mobility, difficulty walking, decreased ROM, decreased strength, hypomobility, impaired flexibility, and pain. These impairments are limiting patient from cleaning, community activity, meal prep, occupation, laundry, yard work, and shopping.         GOALS:     SHORT TERM GOALS:   STG Name Target Date Goal status  1 Pt will be independent and compliant with HEP for improved pain, ROM, strength, and function.  Baseline:  01/18/2021 GOAL MET  2 Pt will demo at least 8-10 deg increase in AROM of DF, PF, and Inv for improved stiffness and mobility.   Baseline:  01/25/2021 PROGRESSING  3 Pt will progress with [place] as MD allows without adverse effects for improved gait and mobility Baseline: 01/25/2021 GOAL MET  4 Pt will perform a 6 inch step up with good control and form.  Baseline: 02/22/2021 INITIAL  5 Pt will be able to perform her daily transfers without difficulty. Baseline: 02/22/2021 ONGOING                      LONG TERM GOALS:    LTG Name Target Date Goal status  1 Pt will ambulate with a normalized heel to toe gait pattern without limping.  Baseline: 03/08/2021 ONGOING  2 Pt will ambulate community distance without an AD without significant pain or difficulty.   Baseline: 03/08/2021 PROGRESSING  3 Pt will perform stairs with a reciprocal gait without rail with good control.  Baseline: 03/08/2021 INITIAL  4 Pt will demo at least 10 deg of L ankle DF AROM for improved stiffness and gait.  Baseline: 03/08/2021 PARTIALLY MET  5 Pt will demo  5/5 L ankle strength in DF, Eve, and Inv and WFL for PF (tested in [place]) for improved tolerance to daily mobility and taking care of kids and to assist in restoring PLOF.  Baseline: 03/08/2021 INITIAL  6 Pt will be able to perform her normal standing activities including household chores without significant pain or difficulty.  Baseline: 03/08/2021 ONGOING             PLAN:  PLANNED INTERVENTIONS: Therapeutic exercises, Therapeutic activity, Neuro Muscular re-education, Balance training, Gait training, Patient/Family education, Joint mobilization, Stair training, Aquatic Therapy, Electrical stimulation, Cryotherapy, Taping, and Manual therapy   PLAN FOR NEXT SESSION: Pt has a high co-pay and will reduce frequency of appt's.  Pt to cont 1x/wk with progression to once every other week. Cont with ankle ROM, Gait training, and progressing WB'ing.  Ice as needed to reduce swelling, soreness, and pain.       Selinda Michaels III PT, DPT 02/26/21 6:27 PM

## 2021-03-03 ENCOUNTER — Encounter (HOSPITAL_BASED_OUTPATIENT_CLINIC_OR_DEPARTMENT_OTHER): Payer: 59 | Admitting: Physical Therapy

## 2021-03-05 ENCOUNTER — Other Ambulatory Visit: Payer: Self-pay

## 2021-03-05 ENCOUNTER — Ambulatory Visit (HOSPITAL_BASED_OUTPATIENT_CLINIC_OR_DEPARTMENT_OTHER): Payer: 59 | Admitting: Physical Therapy

## 2021-03-05 ENCOUNTER — Encounter (HOSPITAL_BASED_OUTPATIENT_CLINIC_OR_DEPARTMENT_OTHER): Payer: Self-pay | Admitting: Physical Therapy

## 2021-03-05 DIAGNOSIS — M25572 Pain in left ankle and joints of left foot: Secondary | ICD-10-CM | POA: Diagnosis not present

## 2021-03-05 DIAGNOSIS — M25672 Stiffness of left ankle, not elsewhere classified: Secondary | ICD-10-CM

## 2021-03-05 DIAGNOSIS — R262 Difficulty in walking, not elsewhere classified: Secondary | ICD-10-CM

## 2021-03-05 DIAGNOSIS — M6281 Muscle weakness (generalized): Secondary | ICD-10-CM

## 2021-03-05 NOTE — Therapy (Addendum)
OUTPATIENT PHYSICAL THERAPY TREATMENT NOTE  Patient Name: Janet Dalton MRN: [id number] DOB:[birth year], 46 y.o., female Today's Date: 03/05/2021  PCP: Orpah Melter, MD  PT End of Session - 03/05/21 1022     Visit Number 16    Number of Visits 20    Date for PT Re-Evaluation 03/08/21    Authorization Type U[employer]    PT Start Time 0940    PT Stop Time 1022    PT Time Calculation (min) 42 min    Activity Tolerance Patient tolerated treatment well    Behavior During Therapy [facility] for tasks assessed/performed                     History reviewed. No pertinent past medical history. Past Surgical History:  Procedure Laterality Date   EYE SURGERY     KNEE SURGERY Left    TIBIA IM NAIL INSERTION Left 11/30/2020   Procedure: INTRAMEDULLARY (IM) NAIL TIBIAL AND TIBIAL PLATE;  Surgeon: Vanetta Mulders, MD;  Location: [place];  Service: Orthopedics;  Laterality: Left;   Patient Active Problem List   Diagnosis Date Noted   Left tibial fracture 11/29/2020    REFERRING PROVIDER: Vanetta Mulders, MD   REFERRING DIAG: S82.52XD (ICD-10-CM) - Closed displaced fracture of medial malleolus of left tibia with routine healing, subsequent encounter    THERAPY DIAG:  Pain in left ankle and joints of left foot   Stiffness of left ankle, not elsewhere classified   Muscle weakness (generalized)   Difficulty in walking, not elsewhere classified   ONSET DATE:  Injury 11/29/2020 / Surgery 11/30/2020   SUBJECTIVE:    SUBJECTIVE STATEMENT: -Pt is 13 weeks and 4 days s/p left intramedullary nail of the tibia on 11/30/2020.  PT spoke with MD and MD states pt is fine to perform ankle resisted exercises.    RESPONSE TO PRIOR RX:  Pt denies any adverse effects after prior Rx.  HEP COMPLIANCE:  Pt reports compliance with HEP. FUNCTIONAL IMPROVEMENTS:  ambulation, standing, stairs, make and carry her own coffee, driving, ROM FUNCTIONAL LIMITATIONS: ambulation, taking care of kids, household  chores, descending stairs   Pt has concerns about her ankle.  Pt states she had significantly increased pain after standing a long time at a birthday party.  She states she could barely walk on it the following day.  She then felt better the following day.  Pt states she is concerned about the bruising/color of her foot.  She states she feels like her circulation and coloring of her foot is worse with standing.  Pt still has numbness though reports a little improvement.    PERTINENT HISTORY: MD notes/orders:   WBAT.  Prior L knee surgeries x 2 (approx 7 years ago)   PAIN:  Are you having pain? Yes NRPS scale: 0/10 currently at rest, 0/10 best, 4/10 worst.  Pt states her upper back is feeling better today though does cont to have a little lumbar pain.  Pain location:  lateral ankle and distal lateral LE Pain orientation: Left      PRECAUTIONS: Other: per surgery and WB'ing orders per MD.  See below    WEIGHT BEARING RESTRICTIONS Yes; weightbearing as tolerated on the left leg.    PLOF: Independent;  Pt ambulated without an AD.  Pt was able to ambulate community distance and perform all of her normal functional mobility skills independently without limitation.  Pt was able to take care of her kids without difficulty.  PATIENT GOALS:  return to PLOF     OBJECTIVE:    TODAY'S TREATMENT:      -Therapeutic Exercise: -Reviewed response to prior Rx, current function, HEP compliance, and pain levels.   -Pt received L ankle PROM in DF, PF, Eve, and Inv.  -Pt performed:   Bike x 5 mins Ankle DF with RTB, PF with GTB, inv and eve with YTB 2x10 reps each Seated submaximal isometrics in inv approx 5 reps  Standing heel raises 2x10 reps -See pt education below -Updated HEP and gave pt a HEP Handout.   -Kinetic Activities: (for improved gait and mobility)  Pt performed   Low level march on airex 2 x 10 reps  Gait Sim without UE assist 2x10 reps  Step ups on 6 inch step and 8 inch  step without UE assist x10 reps each Lateral step ups on 6 inch step 2x10 reps Mini squats with RTB around thighs with/without bilat UE assist 2x10 reps      PATIENT EDUCATION:  Education details:  PT answered questions and provided reassurance.  Instructed pt to contact/see MD if her numbness is not improving, pain is getting worse, or if she is having circulation issues.  Informed pt she could add submax isometric inversion or YTB inversion and YTB eversion to HEP as long as she didn't have pain > 2-3/10.  HEP.  Instructed pt she should not have pain with exercises.  exercise form including verbal/visual/tactile cuing for knee alignment with mini squats.   Person educated: Patient Education method: Explanation, Demonstration, Tactile cues, Verbal cues, handout Education comprehension: verbalized understanding, returned demonstration, verbal cues required, and tactile cues required     HOME EXERCISE PROGRAM: Access Code: [email or web address] URL: [email or web address] Date: 02/26/2021 Prepared by: Ronny Flurry  Exercises ANKLE PUMPS - 2-3 x daily - 7 x weekly - 2 sets - 10 reps Seated Toe Curl - 3 x daily - 7 x weekly - 2-3 sets - 10 reps Long Sitting Calf Stretch with Strap - 2 x daily - 7 x weekly - 2-3 reps - 20 seconds hold Supine Heel Slide - 2 x daily - 7 x weekly - 2 sets - 10 reps Supine Active Straight Leg Raise - 1 x daily - 6-7 x weekly - 2 sets - 10 reps Seated Ankle Alphabet - 2-3 x daily - 7 x weekly - 1 reps Seated Ankle Circles - 2 x daily - 7 x weekly - 2 sets - 10 reps Sidelying Hip Abduction - 1 x daily - 5-7 x weekly - 2 sets - 10 reps Ankle Inversion Eversion Towel Slide - 2 x daily - 7 x weekly - 2 sets - 10 reps Seated Toe Raise - 2 x daily - 7 x weekly - 2 sets - 10 reps Seated Heel Raise - 2 x daily - 7 x weekly - 2 sets - 10 reps Gastroc Stretch on Wall - 2 x daily - 7 x weekly - 2 reps - 20-30 seconds hold Long Sitting Ankle Dorsiflexion with Anchored  Resistance - 1 x daily - 3-4 x weekly - 2-3 sets - 10 reps Ankle and Toe Plantarflexion with Resistance - 1 x daily - 3-4 x weekly - 2-3 sets - 10 reps       ASSESSMENT:   CLINICAL IMPRESSION: Pt presents to Rx with concerns about pain, bruising, and numbness.  PT answered all of her questions and provided reassurance.  Pt reports her numbness and pain overall are improving though  she had a bad day of pain after standing a long time at a birthday party.  Pt has weakness in inv and eversion.  She continues to have difficulty with inversion though demonstrates improved AROM and tolerance with strengthening.  Pt is improving with tolerance and performance of closed chain exercises.  Pt continues to have difficulty with correct form with mini squats.  PT used a T-band around thighs to improve form.  Pt responded well to rx having no c/o's after Rx.  Pt should continue to benefit from continued skilled PT to address impairments and goals and to assist with restoring PLOF.       Objective impairments include Abnormal gait, decreased activity tolerance, decreased endurance, decreased mobility, difficulty walking, decreased ROM, decreased strength, hypomobility, impaired flexibility, and pain. These impairments are limiting patient from cleaning, community activity, meal prep, occupation, laundry, yard work, and shopping.         GOALS:     SHORT TERM GOALS:   STG Name Target Date Goal status  1 Pt will be independent and compliant with HEP for improved pain, ROM, strength, and function.  Baseline:  01/18/2021 GOAL MET  2 Pt will demo at least 8-10 deg increase in AROM of DF, PF, and Inv for improved stiffness and mobility.   Baseline:  01/25/2021 PROGRESSING  3 Pt will progress with [place] as MD allows without adverse effects for improved gait and mobility Baseline: 01/25/2021 GOAL MET  4 Pt will perform a 6 inch step up with good control and form.  Baseline: 02/22/2021 INITIAL  5 Pt will be  able to perform her daily transfers without difficulty. Baseline: 02/22/2021 ONGOING                      LONG TERM GOALS:    LTG Name Target Date Goal status  1 Pt will ambulate with a normalized heel to toe gait pattern without limping.  Baseline: 03/08/2021 ONGOING  2 Pt will ambulate community distance without an AD without significant pain or difficulty.   Baseline: 03/08/2021 PROGRESSING  3 Pt will perform stairs with a reciprocal gait without rail with good control.  Baseline: 03/08/2021 INITIAL  4 Pt will demo at least 10 deg of L ankle DF AROM for improved stiffness and gait.  Baseline: 03/08/2021 PARTIALLY MET  5 Pt will demo 5/5 L ankle strength in DF, Eve, and Inv and WFL for PF (tested in [place]) for improved tolerance to daily mobility and taking care of kids and to assist in restoring PLOF.  Baseline: 03/08/2021 INITIAL  6 Pt will be able to perform her normal standing activities including household chores without significant pain or difficulty.  Baseline: 03/08/2021 ONGOING             PLAN:  PLANNED INTERVENTIONS: Therapeutic exercises, Therapeutic activity, Neuro Muscular re-education, Balance training, Gait training, Patient/Family education, Joint mobilization, Stair training, Aquatic Therapy, Electrical stimulation, Cryotherapy, Taping, and Manual therapy   PLAN FOR NEXT SESSION: Pt has a high co-pay and will reduce frequency of appt's.  Pt to cont 1x/wk with progression to once every other week. Cont with ankle ROM, Gait training, and progressing closed chain activities.  Ice as needed to reduce swelling, soreness, and pain.       Selinda Michaels III PT, DPT 03/05/21 3:10 PM

## 2021-03-16 ENCOUNTER — Encounter (HOSPITAL_BASED_OUTPATIENT_CLINIC_OR_DEPARTMENT_OTHER): Payer: Self-pay | Admitting: Physical Therapy

## 2021-03-16 ENCOUNTER — Other Ambulatory Visit: Payer: Self-pay

## 2021-03-16 ENCOUNTER — Ambulatory Visit (HOSPITAL_BASED_OUTPATIENT_CLINIC_OR_DEPARTMENT_OTHER): Payer: 59 | Admitting: Physical Therapy

## 2021-03-16 DIAGNOSIS — M25572 Pain in left ankle and joints of left foot: Secondary | ICD-10-CM | POA: Diagnosis not present

## 2021-03-16 DIAGNOSIS — M6281 Muscle weakness (generalized): Secondary | ICD-10-CM

## 2021-03-16 DIAGNOSIS — R262 Difficulty in walking, not elsewhere classified: Secondary | ICD-10-CM

## 2021-03-16 DIAGNOSIS — M25672 Stiffness of left ankle, not elsewhere classified: Secondary | ICD-10-CM

## 2021-03-16 NOTE — Therapy (Addendum)
OUTPATIENT PHYSICAL THERAPY TREATMENT NOTE  Patient Name: Janet Dalton MRN: [id number] DOB:[birth year], 46 y.o., female Today's Date: 03/16/2021  PCP: Orpah Melter, MD  PT End of Session - 03/16/21 1027     Visit Number 17    Number of Visits 25    Date for PT Re-Evaluation 04/13/21    Authorization Type U[employer]    Progress Note Due on Visit --   04/13/2021   PT Start Time 1022    PT Stop Time 1107    PT Time Calculation (min) 45 min    Activity Tolerance Patient tolerated treatment well    Behavior During Therapy [facility] for tasks assessed/performed                     History reviewed. No pertinent past medical history. Past Surgical History:  Procedure Laterality Date   EYE SURGERY     KNEE SURGERY Left    TIBIA IM NAIL INSERTION Left 11/30/2020   Procedure: INTRAMEDULLARY (IM) NAIL TIBIAL AND TIBIAL PLATE;  Surgeon: Vanetta Mulders, MD;  Location: [place];  Service: Orthopedics;  Laterality: Left;   Patient Active Problem List   Diagnosis Date Noted   Left tibial fracture 11/29/2020    REFERRING PROVIDER: Vanetta Mulders, MD   REFERRING DIAG: S82.52XD (ICD-10-CM) - Closed displaced fracture of medial malleolus of left tibia with routine healing, subsequent encounter    THERAPY DIAG:  Pain in left ankle and joints of left foot   Stiffness of left ankle, not elsewhere classified   Muscle weakness (generalized)   Difficulty in walking, not elsewhere classified   ONSET DATE:  Injury 11/29/2020 / Surgery 11/30/2020   SUBJECTIVE:    SUBJECTIVE STATEMENT: -Pt is 15 weeks and 1 day s/p left intramedullary nail of the tibia on 11/30/2020.    RESPONSE TO PRIOR RX:  Pt denies any adverse effects after prior Rx.  HEP COMPLIANCE:  Pt reports compliance with HEP. FUNCTIONAL IMPROVEMENTS:  ambulation, standing, stairs, make and carry her own coffee, driving, ROM, carrying groceries.  Pt states she is able to perform most of her household chores without much  difficulty.  FUNCTIONAL LIMITATIONS: ambulation, gait speed, descending stairs.  Minimal to moderate difficulty with transfers.    She states she feels like her circulation and coloring of her foot is worse with standing a long duration.  Pt still has numbness though reports a little improvement.    PERTINENT HISTORY: MD notes/orders:   WBAT.  Prior L knee surgeries x 2 (approx 7 years ago)   PAIN:  Are you having pain? Yes ; Pt states he back is still bothering her.  NRPS scale: 1-2/10 currently at rest, 0/10 best, 3/10 worst.  Pt states her upper back is feeling better today though does cont to have a little lumbar pain.  Pain location:  L medial ankle and distal LE  Pain orientation: Left      PRECAUTIONS: Other: per surgery and WB'ing orders per MD.  See below    WEIGHT BEARING RESTRICTIONS Yes; weightbearing as tolerated on the left leg.    PLOF: Independent;  Pt ambulated without an AD.  Pt was able to ambulate community distance and perform all of her normal functional mobility skills independently without limitation.  Pt was able to take care of her kids without difficulty.     PATIENT GOALS:  return to PLOF     OBJECTIVE:    TODAY'S TREATMENT:       PHYSICAL PERFORMANCE TESTING:            -  Reviewed reported functional progress and deficits, HEP compliance, and pain levels.     FOTO:  27 with a goal of 61      L Ankle AROM/PROM:               DF:  11/18, PF: 48, Inv: 12/18, Eve:  8/10  L Ankle Strength: DF:  5/5, PF: tolerates good manual resistance, Inv:  tol min resistance, Eve: 4/5. Pt able to perform heel raises close to symmetrical bilat.  Her R LE raises a little higher than L   GAIT:        Pt had no limp with ambulating at a slow pace and minimally favors L LE.  Improved heel strike and toe off and stance time on L LE.  Improved foot alignment having reduced toe out.    When pt ambulates faster, she has increased limp and favors R LE.  1-2/10 pain during  gait    Stairs:  Pt ascends stairs with a reciprocal gait without rail and descend stairs with a step to gait without rail with L LE leading.   -Therapeutic Exercise: -Reviewed response to prior Rx, current function, HEP compliance, and pain levels.   -Pt received L ankle PROM in DF, PF, Eve, and Inv.  -Pt performed:   Bike x 5 mins Standing heel raises 2x10 reps -Educated pt concerning HEP.   -Kinetic Activities: (for improved gait and mobility)  Pt performed   March on airex 2 x 10 reps  Gait Sim without UE assist 2x10 reps  SLS 3x20 sec with frequent UE assist         PATIENT EDUCATION:  Education details:  PT answered questions.  Instructed pt to contact/see MD if her numbness is not improving, pain is getting worse, or if she is having circulation issues.  HEP, POC, and objective findings.   Person educated: Patient Education method: Explanation, Demonstration, Tactile cues, Verbal cues, handout Education comprehension: verbalized understanding, returned demonstration, verbal cues required, and tactile cues required     HOME EXERCISE PROGRAM: Access Code: [email or web address] URL: [email or web address] Date: 02/26/2021 Prepared by: Ronny Flurry  Exercises ANKLE PUMPS - 2-3 x daily - 7 x weekly - 2 sets - 10 reps Seated Toe Curl - 3 x daily - 7 x weekly - 2-3 sets - 10 reps Long Sitting Calf Stretch with Strap - 2 x daily - 7 x weekly - 2-3 reps - 20 seconds hold Supine Heel Slide - 2 x daily - 7 x weekly - 2 sets - 10 reps Supine Active Straight Leg Raise - 1 x daily - 6-7 x weekly - 2 sets - 10 reps Seated Ankle Alphabet - 2-3 x daily - 7 x weekly - 1 reps Seated Ankle Circles - 2 x daily - 7 x weekly - 2 sets - 10 reps Sidelying Hip Abduction - 1 x daily - 5-7 x weekly - 2 sets - 10 reps Ankle Inversion Eversion Towel Slide - 2 x daily - 7 x weekly - 2 sets - 10 reps Seated Toe Raise - 2 x daily - 7 x weekly - 2 sets - 10 reps Seated Heel Raise - 2 x daily  - 7 x weekly - 2 sets - 10 reps Gastroc Stretch on Wall - 2 x daily - 7 x weekly - 2 reps - 20-30 seconds hold Long Sitting Ankle Dorsiflexion with Anchored Resistance - 1 x daily - 3-4 x weekly - 2-3 sets - 10 reps Ankle and  Toe Plantarflexion with Resistance - 1 x daily - 3-4 x weekly - 2-3 sets - 10 reps       ASSESSMENT:   CLINICAL IMPRESSION: Pt is making slow but steady progress with function, gait, ROM, and mobility.  Pt is improving with performance of and tolerance with functional mobility as evidenced by subjective reports.  She demonstrates improved gait pattern including reduced limp, improved heel strike and toe off, and improved stance time on L LE.  Pt ambulates with a slow gait speed and gait worsens when increasing speed.  Pt is able to ascend stairs with a reciprocal gait pattern without rail but has difficulty descending stairs performing a step to gait pattern.  She demonstrates improved DF and PF AROM though was minimally less in Inv and Eve ROM.  She continues to have difficulty performing Inv > Eve.  Pt is improving with L ankle strength as evidenced by MMT.  Pt demonstrates improved self perceived disability with FOTO score improving from 31 prior to 15 currently.  Pt is progressing toward goals.  Pt has been limited with PT appt's lately due to a high co-pay though may benefit from increasing frequency of PT treatments.  Pt should continue to benefit from continued skilled PT to address impairments and goals and to assist with restoring PLOF.      Objective impairments include Abnormal gait, decreased activity tolerance, decreased endurance, decreased mobility, difficulty walking, decreased ROM, decreased strength, hypomobility, impaired flexibility, and pain. These impairments are limiting patient from cleaning, community activity, meal prep, occupation, laundry, yard work, and shopping.         GOALS:     SHORT TERM GOALS:   STG Name Target Date Goal status  1 Pt  will be independent and compliant with HEP for improved pain, ROM, strength, and function.  Baseline:  01/18/2021 GOAL MET  2 Pt will demo at least 8-10 deg increase in AROM of DF, PF, and Inv for improved stiffness and mobility.   Baseline:  01/25/2021 66% MET  3 Pt will progress with [place] as MD allows without adverse effects for improved gait and mobility Baseline: 01/25/2021 GOAL MET  4 Pt will perform a 6 inch step up with good control and form.  Baseline: 02/22/2021 GOAL MET  5 Pt will be able to perform her daily transfers without difficulty. Baseline: 02/22/2021 PARTIALLY MET                      LONG TERM GOALS:    LTG Name Target Date Goal status  1 Pt will ambulate with a normalized heel to toe gait pattern without limping.  Baseline: 04/13/2021 ONGOING  2 Pt will ambulate community distance without an AD without significant pain or difficulty.   Baseline: 04/13/2021 PROGRESSING  3 Pt will perform stairs with a reciprocal gait without rail with good control.  Baseline: 04/13/2021 PARTIALLY MET  4 Pt will demo at least 10 deg of L ankle DF AROM for improved stiffness and gait.  Baseline: 03/08/2021 GOAL MET  5 Pt will demo 5/5 L ankle strength in DF, Eve, and Inv and WFL for PF (tested in [place]) for improved tolerance to daily mobility and taking care of kids and to assist in restoring PLOF.  Baseline: 04/13/2021 PROGRESSING  6 Pt will be able to perform her normal standing activities including household chores without significant pain or difficulty.  Baseline: 04/13/2021 PARTIALLY MET  PLAN: FREQUENCY:  1 - 2x per week   DURATION:  4 weeks  PLANNED INTERVENTIONS: Therapeutic exercises, Therapeutic activity, Neuro Muscular re-education, Balance training, Gait training, Patient/Family education, Joint mobilization, Stair training, Aquatic Therapy, Electrical stimulation, Cryotherapy, Taping, and Manual therapy   PLAN FOR NEXT SESSION: Pt has a high co-pay and will  reduce frequency of appt's.  Pt to cont 1x/wk with progression to once every other week. Cont with ankle ROM, Gait training, and progressing closed chain activities.  Ice as needed to reduce swelling, soreness, and pain.       Selinda Michaels III PT, DPT 03/16/21 10:19 PM

## 2021-03-24 ENCOUNTER — Encounter (HOSPITAL_BASED_OUTPATIENT_CLINIC_OR_DEPARTMENT_OTHER): Payer: 59 | Admitting: Physical Therapy

## 2021-03-29 ENCOUNTER — Ambulatory Visit (INDEPENDENT_AMBULATORY_CARE_PROVIDER_SITE_OTHER): Payer: 59 | Admitting: Orthopaedic Surgery

## 2021-03-29 ENCOUNTER — Ambulatory Visit (HOSPITAL_BASED_OUTPATIENT_CLINIC_OR_DEPARTMENT_OTHER)
Admission: RE | Admit: 2021-03-29 | Discharge: 2021-03-29 | Disposition: A | Discharge status: Home / Self Care | Payer: 59 | Source: Ambulatory Visit | Attending: Orthopaedic Surgery | Admitting: Orthopaedic Surgery

## 2021-03-29 ENCOUNTER — Other Ambulatory Visit (HOSPITAL_BASED_OUTPATIENT_CLINIC_OR_DEPARTMENT_OTHER): Payer: Self-pay | Admitting: Orthopaedic Surgery

## 2021-03-29 ENCOUNTER — Other Ambulatory Visit: Payer: Self-pay

## 2021-03-29 DIAGNOSIS — S8252XD Displaced fracture of medial malleolus of left tibia, subsequent encounter for closed fracture with routine healing: Secondary | ICD-10-CM | POA: Insufficient documentation

## 2021-03-29 NOTE — Progress Notes (Signed)
Post Operative Evaluation    Procedure/Date of Surgery: 11/30/20 -left tibial nail for tib-fib fracture  Interval History:   03/29/2021: Presents today with continued concerns.  She states that she is somewhat concerned about the discoloration on this side compared to the contralateral.  She is having some numbness in the dorsal aspect of the foot which is improving.  She also states that she has some pain with external rotation at the ankle.  PMH/PSH/Family History/Social History/Meds/Allergies:   No past medical history on file. Past Surgical History:  Procedure Laterality Date   EYE SURGERY     KNEE SURGERY Left    TIBIA IM NAIL INSERTION Left 11/30/2020   Procedure: INTRAMEDULLARY (IM) NAIL TIBIAL AND TIBIAL PLATE;  Surgeon: Vanetta Mulders, MD;  Location: [place];  Service: Orthopedics;  Laterality: Left;   Social History   Socioeconomic History   Marital status: Married    Spouse name: Not on file   Number of children: Not on file   Years of education: Not on file   Highest education level: Not on file  Occupational History   Not on file  Tobacco Use   Smoking status: Never   Smokeless tobacco: Never  Substance and Sexual Activity   Alcohol use: Never   Drug use: Never   Sexual activity: Not on file  Other Topics Concern   Not on file  Social History Narrative   Not on file   Social Determinants of Health   Financial Resource Strain: Not on file  Food Insecurity: Not on file  Transportation Needs: Not on file  Physical Activity: Not on file  Stress: Not on file  Social Connections: Not on file   No family history on file. No Known Allergies Current Outpatient Medications  Medication Sig Dispense Refill   aspirin EC 325 MG tablet Take 1 tablet (325 mg total) by mouth daily. (Patient not taking: Reported on 03/05/2021) 30 tablet 0   oxycodone (OXY-IR) 5 MG capsule Take 1 capsule (5 mg total) by mouth every 4 (four) hours as  needed (severe pain). (Patient not taking: Reported on 03/05/2021) 20 capsule 0   No current facility-administered medications for this visit.   No results found.  Review of Systems:   A ROS was performed including pertinent positives and negatives as documented in the HPI.   Musculoskeletal Exam:     Incisions are well-healed with no erythema or drainage.  Left knee range of motion is from 0 to 140 degrees.  Left ankle dorsiflexion is to 25 degrees plantarflexion is 30 degrees.  Trace swelling about the ankle.  Small patch of numbness about the dorsum of the left foot   Imaging:    X-rays 4 views left tibia Status post intramedullary nail left tibia without evidence of hardware complication.  There is interval callus formation  I personally reviewed and interpreted the radiographs.   Assessment:   46 year old female status post left tibial nail.  She continues to have increased callus formation about the tibia.  Her fibula is increasing as well although this is slower to heal.  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  At today's visit I have continued advised that I would like her to continue to progress her weightbearing on that side.  I would like her to increase range of motion.  There  is no clinical concern for any type of infection.  I do believe that there is somewhat delayed healing at this point.  Plan :    -Return to clinic in 8 weeks   I personally saw and evaluated the patient, and participated in the management and treatment plan.  Vanetta Mulders, MD Attending Physician, Orthopedic Surgery  This document was dictated using Dragon voice recognition software. A reasonable attempt at proof reading has been made to minimize errors.

## 2021-04-09 ENCOUNTER — Ambulatory Visit (HOSPITAL_BASED_OUTPATIENT_CLINIC_OR_DEPARTMENT_OTHER): Payer: 59 | Attending: Orthopaedic Surgery | Admitting: Physical Therapy

## 2021-04-09 ENCOUNTER — Other Ambulatory Visit: Payer: Self-pay

## 2021-04-09 ENCOUNTER — Encounter (HOSPITAL_BASED_OUTPATIENT_CLINIC_OR_DEPARTMENT_OTHER): Payer: Self-pay | Admitting: Physical Therapy

## 2021-04-09 DIAGNOSIS — R262 Difficulty in walking, not elsewhere classified: Secondary | ICD-10-CM | POA: Diagnosis present

## 2021-04-09 DIAGNOSIS — M25572 Pain in left ankle and joints of left foot: Secondary | ICD-10-CM | POA: Insufficient documentation

## 2021-04-09 DIAGNOSIS — M25672 Stiffness of left ankle, not elsewhere classified: Secondary | ICD-10-CM | POA: Insufficient documentation

## 2021-04-09 DIAGNOSIS — M6281 Muscle weakness (generalized): Secondary | ICD-10-CM | POA: Diagnosis present

## 2021-04-09 NOTE — Therapy (Addendum)
OUTPATIENT PHYSICAL THERAPY TREATMENT NOTE / PROGRESS NOTE  Patient Name: Ahrianna Siglin MRN: [id number] DOB:01-Apr-1975, 46 y.o., female Today's Date: 04/09/2021  PCP: Orpah Melter, MD  PT End of Session - 04/09/21 0840     Visit Number 18    Number of Visits 24    Date for PT Re-Evaluation 05/21/21    Authorization Type UHC    Progress Note Due on Visit --   05/07/2021   PT Start Time 0805    PT Stop Time 0850    PT Time Calculation (min) 45 min    Activity Tolerance Patient tolerated treatment well    Behavior During Therapy [facility] for tasks assessed/performed                      History reviewed. No pertinent past medical history. Past Surgical History:  Procedure Laterality Date   EYE SURGERY     KNEE SURGERY Left    TIBIA IM NAIL INSERTION Left 11/30/2020   Procedure: INTRAMEDULLARY (IM) NAIL TIBIAL AND TIBIAL PLATE;  Surgeon: Vanetta Mulders, MD;  Location: [place];  Service: Orthopedics;  Laterality: Left;   Patient Active Problem List   Diagnosis Date Noted   Left tibial fracture 11/29/2020    REFERRING PROVIDER: Vanetta Mulders, MD   REFERRING DIAG: S82.52XD (ICD-10-CM) - Closed displaced fracture of medial malleolus of left tibia with routine healing, subsequent encounter    THERAPY DIAG:  Pain in left ankle and joints of left foot   Stiffness of left ankle, not elsewhere classified   Muscle weakness (generalized)   Difficulty in walking, not elsewhere classified   ONSET DATE:  Injury 11/29/2020 / Surgery 11/30/2020   SUBJECTIVE:    SUBJECTIVE STATEMENT: -Pt is 18 weeks and 4 days s/p left intramedullary nail of the tibia on 11/30/2020.  Pt states she continues to have medial ankle pain.  Pt is concerned her body is rejecting her hardware.  Pt saw PT earlier this month and he states he can remove the hardware in 2 months if it is bothering her.   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 He advised her to continue to progress her  weightbearing on that side and increase range of motion.  There is no clinical concern for any type of infection.  He does believe that there is somewhat delayed healing at this point.   RESPONSE TO PRIOR RX:  Pt denies any adverse effects after prior Rx.  HEP COMPLIANCE:  Pt reports compliance with HEP. FUNCTIONAL IMPROVEMENTS:  ambulation, standing, stairs, make and carry her own coffee, driving, ROM, carrying groceries.  Pt states she is able to perform most of her household chores without much difficulty.  Pt reports no difficulty with transfers.  FUNCTIONAL LIMITATIONS: ambulation, gait speed, descending stairs.  Walking up/down her sloped driveway    PERTINENT HISTORY: MD notes/orders:   WBAT.  Prior L knee surgeries x 2 (approx 7 years ago)   PAIN:  Are you having pain? Yes ; Pt states he back is still bothering her.  NRPS scale: 2/10 currently, 0/10 best, 3/10 worst.    Pain location:  L medial ankle and distal LE  Pain orientation: Left      PRECAUTIONS: Other: per surgery and WB'ing orders per MD.  See below    WEIGHT BEARING RESTRICTIONS Yes; weightbearing as tolerated on the left leg.    PLOF: Independent;  Pt ambulated without an AD.  Pt was able to ambulate community distance  and perform all of her normal functional mobility skills independently without limitation.  Pt was able to take care of her kids without difficulty.     PATIENT GOALS:  return to PLOF     OBJECTIVE:    TODAY'S TREATMENT:        Therapeutic Exercise:              -Reviewed reported functional progress and deficits, HEP compliance, and pain levels.     FOTO:  59 with a goal of 61     L Ankle AROM/PROM:               DF:  14/19, PF: 47, Inv: 18/20, Eve:  10/14   R ankle DF AROM:  18 deg   L Ankle Strength: DF:  5/5, PF: tolerates good manual resistance, Inv:  tol min resistance, Eve: 5/5. Pt able to perform heel raises close to symmetrical bilat.  Her R LE raises a little higher than  L  SLS:  Pt has to use intermittent UE assistance with SLS.   GAIT:        Pt has improved with heel strike and toe off having a more normalized gait.  She does cont to favor L LE with increased minimally increased stance time on R LE though has improved stance time on L LE.  She still has a slow gait.  Pt reports 2-3/10 medial ankle pain with gait.    Stairs:  Pt ascends stairs with a reciprocal gait without rail and descend stairs with a step to gait with/without rail with L LE leading.   -Pt received L ankle PROM in DF, PF, Eve, and Inv.  -Pt performed:   Bike x 5 mins at L2 Standing heel raises 2x10 reps -Educated pt concerning HEP and updated HEP.   -Kinetic Activities: (for improved gait and mobility)  Pt performed   Retro step up on 4 inch step  2x10 reps  Gait Sim without UE assist 2x10 reps  SLS 3x20 sec with intermittent UE assist     PATIENT EDUCATION:  Education details:  PT answered questions. POC and objective findings.  Updated HEP and gave pt a HEP handout.  Educated pt in correct form and appropriate frequency.  Person educated: Patient Education method: Explanation, Demonstration, Tactile cues, Verbal cues, handout Education comprehension: verbalized understanding, returned demonstration, verbal cues required, and tactile cues required     HOME EXERCISE PROGRAM: Access Code: [email or web address] URL: [email or web address] Date: 02/26/2021 Prepared by: Ronny Flurry  Exercises ANKLE PUMPS - 2-3 x daily - 7 x weekly - 2 sets - 10 reps Seated Toe Curl - 3 x daily - 7 x weekly - 2-3 sets - 10 reps Long Sitting Calf Stretch with Strap - 2 x daily - 7 x weekly - 2-3 reps - 20 seconds hold Supine Heel Slide - 2 x daily - 7 x weekly - 2 sets - 10 reps Supine Active Straight Leg Raise - 1 x daily - 6-7 x weekly - 2 sets - 10 reps Seated Ankle Alphabet - 2-3 x daily - 7 x weekly - 1 reps Seated Ankle Circles - 2 x daily - 7 x weekly - 2 sets - 10 reps Sidelying  Hip Abduction - 1 x daily - 5-7 x weekly - 2 sets - 10 reps Ankle Inversion Eversion Towel Slide - 2 x daily - 7 x weekly - 2 sets - 10 reps Seated Toe Raise - 2 x daily - 7 x  weekly - 2 sets - 10 reps Seated Heel Raise - 2 x daily - 7 x weekly - 2 sets - 10 reps Gastroc Stretch on Wall - 2 x daily - 7 x weekly - 2 reps - 20-30 seconds hold Long Sitting Ankle Dorsiflexion with Anchored Resistance - 1 x daily - 3-4 x weekly - 2-3 sets - 10 reps Ankle and Toe Plantarflexion with Resistance - 1 x daily - 3-4 x weekly - 2-3 sets - 10 reps Single Leg Stance - 1 x daily - 7 x weekly - 3 reps - 20 second hold Gait Sim  once per day, 2 sets of 10 reps       ASSESSMENT:   CLINICAL IMPRESSION: Pt has been unable to attend PT recently due to being sick and our PT schedule.  Pt continues to make slow but steady progress.  Though she continues to have gait deficits including slow gait speed and minimally favoring L LE, she has improved quality of gait normalizing her heel to toe gait.  Pt is able to ascend stairs well though cont to have deficits with descending stairs.  Pt reports she is able to perform her normal transfers without difficulty.  Pt reports she continues to have pain with ambulation.  Pt demonstrates improved L ankle DF, Inv, and Eve ROM.  She still has deficits with Inv strength and ROM.  Pt has good strength in DF and improved ER strength to 5/5 MMT.  Pt has proprioceptive deficits as evidenced by performance of balance exercises.  Pt is making progress toward goals though still has pain and limitations with her functional mobility skills.  Pt should benefit from continued skilled PT to address impairments and goals and to assist with restoring PLOF.     Objective impairments include Abnormal gait, decreased activity tolerance, decreased endurance, decreased mobility, difficulty walking, decreased ROM, decreased strength, hypomobility, impaired flexibility, and pain. These impairments are  limiting patient from cleaning, community activity, meal prep, occupation, laundry, yard work, and shopping.         GOALS:     SHORT TERM GOALS:   STG Name Target Date Goal status  1 Pt will be independent and compliant with HEP for improved pain, ROM, strength, and function.  Baseline:  01/18/2021 GOAL MET  2 Pt will demo at least 8-10 deg increase in AROM of DF, PF, and Inv for improved stiffness and mobility.   Baseline:  01/25/2021 66% MET  3 Pt will progress with [place] as MD allows without adverse effects for improved gait and mobility Baseline: 01/25/2021 GOAL MET  4 Pt will perform a 6 inch step up with good control and form.  Baseline: 02/22/2021 GOAL MET  5 Pt will be able to perform her daily transfers without difficulty. Baseline: 02/22/2021 GOAL MET                      LONG TERM GOALS:    LTG Name Target Date Goal status  1 Pt will ambulate with a normalized heel to toe gait pattern without limping.  Baseline: 05/21/2021  PARTIALLY MET  2 Pt will ambulate community distance without an AD without significant pain or difficulty.   Baseline: 05/21/2021 PROGRESSING  3 Pt will perform stairs with a reciprocal gait without rail with good control.  Baseline: 05/21/2021 PARTIALLY MET  4 Pt will demo at least 10 deg of L ankle DF AROM for improved stiffness and gait.  Baseline: 03/08/2021 GOAL MET  5 Pt will demo 5/5 L ankle strength in DF, Eve, and Inv and WFL for PF (tested in [place]) for improved tolerance to daily mobility and taking care of kids and to assist in restoring PLOF.  Baseline: 04/13/2021 75%  6 Pt will be able to perform her normal standing activities including household chores without significant pain or difficulty.  Baseline: 05/21/2021 PARTIALLY MET             PLAN: FREQUENCY:  1x per week   DURATION:  4 -6 weeks  PLANNED INTERVENTIONS: Therapeutic exercises, Therapeutic activity, Neuro Muscular re-education, Balance training, Gait training, Patient/Family  education, Joint mobilization, Stair training, Aquatic Therapy, Electrical stimulation, Cryotherapy, Taping, and Manual therapy   PLAN FOR NEXT SESSION: Pt has a high co-pay and will reduce frequency of appt's.  Pt to cont 1x/wk with progression to once every other week. Cont with ankle ROM, Gait training, and progressing closed chain activities.  Ice as needed to reduce swelling, soreness, and pain.       Selinda Michaels III PT, DPT 04/09/21 10:31 AM

## 2021-04-28 ENCOUNTER — Ambulatory Visit (HOSPITAL_BASED_OUTPATIENT_CLINIC_OR_DEPARTMENT_OTHER): Payer: 59 | Attending: Orthopaedic Surgery | Admitting: Physical Therapy

## 2021-04-28 ENCOUNTER — Other Ambulatory Visit: Payer: Self-pay

## 2021-04-28 DIAGNOSIS — M25672 Stiffness of left ankle, not elsewhere classified: Secondary | ICD-10-CM | POA: Insufficient documentation

## 2021-04-28 DIAGNOSIS — M25572 Pain in left ankle and joints of left foot: Secondary | ICD-10-CM | POA: Insufficient documentation

## 2021-04-28 DIAGNOSIS — R262 Difficulty in walking, not elsewhere classified: Secondary | ICD-10-CM | POA: Insufficient documentation

## 2021-04-28 DIAGNOSIS — M6281 Muscle weakness (generalized): Secondary | ICD-10-CM | POA: Diagnosis present

## 2021-04-28 NOTE — Therapy (Signed)
?OUTPATIENT PHYSICAL THERAPY TREATMENT NOTE  ? ?Patient Name: Janet Dalton ?MRN: [id number] ?DOB:December 13, 1975, 46 y.o., female ?Today's Date: 04/29/2021 ? ?PCP: Orpah Melter, MD ? PT End of Session - 04/28/21 1115   ? ? Visit Number 19   ? Number of Visits 24   ? Date for PT Re-Evaluation 05/21/21   ? Authorization Type UHC   ? PT Start Time 1022   ? PT Stop Time 1107   ? PT Time Calculation (min) 45 min   ? Activity Tolerance Patient tolerated treatment well   ? Behavior During Therapy [facility] for tasks assessed/performed   ? ?  ?  ? ?  ? ? ? ? ? ? ? ? ? ? ? ? ?History reviewed. No pertinent past medical history. ?Past Surgical History:  ?Procedure Laterality Date  ? EYE SURGERY    ? KNEE SURGERY Left   ? TIBIA IM NAIL INSERTION Left 11/30/2020  ? Procedure: INTRAMEDULLARY (IM) NAIL TIBIAL AND TIBIAL PLATE;  Surgeon: Vanetta Mulders, MD;  Location: [place];  Service: Orthopedics;  Laterality: Left;  ? ?Patient Active Problem List  ? Diagnosis Date Noted  ? Left tibial fracture 11/29/2020  ? ? ?REFERRING PROVIDER: Vanetta Mulders, MD ?  ?REFERRING DIAG: S82.52XD (ICD-10-CM) - Closed displaced fracture of medial malleolus of left tibia with routine healing, subsequent encounter  ?  ?THERAPY DIAG:  ?Pain in left ankle and joints of left foot ?  ?Stiffness of left ankle, not elsewhere classified ?  ?Muscle weakness (generalized) ?  ?Difficulty in walking, not elsewhere classified ?  ?ONSET DATE:  Injury 11/29/2020 / Surgery 11/30/2020 ?  ?SUBJECTIVE:  ?  ?SUBJECTIVE STATEMENT: ?-Pt is s/p left intramedullary nail of the tibia on 11/30/2020. ? ?Pt states she continues to have medial ankle pain.  Pt states her walking has improved though her medial ankle pain has not changed.  She continues to c/o of medial ankle pain with ambulation and standing for awhile.  Pt has medial ankle pain with grocery shopping.   ?  ?RESPONSE TO PRIOR RX:  Pt denies any adverse effects after prior Rx.  ?HEP COMPLIANCE:  Pt reports compliance with  HEP. ?FUNCTIONAL IMPROVEMENTS:  ambulation, standing, stairs, make and carry her own coffee, driving, ROM, carrying groceries.  Pt states she is able to perform most of her household chores without much difficulty.  Pt reports no difficulty with transfers.  ?FUNCTIONAL LIMITATIONS: ambulation, gait speed, descending stairs.  Walking up/down her sloped driveway  ? ? ?PERTINENT HISTORY: ?MD notes/orders:   WBAT.  Prior L knee surgeries x 2 (approx 7 years ago) ?  ?PAIN:  ?Are you having pain? Yes ; Pt states he back is still bothering her.  ?NRPS scale: 2-3/10 currently, 0/10 best, 3/10 worst.    ?Pain location:  L medial ankle and distal LE  ?Pain orientation: Left  ?  ?  ?PRECAUTIONS: Other: per surgery and WB'ing orders per MD.  See below ? ?  ?WEIGHT BEARING RESTRICTIONS Yes; weightbearing as tolerated on the left leg.   ? ?PLOF: Independent;  Pt ambulated without an AD.  Pt was able to ambulate community distance and perform all of her normal functional mobility skills independently without limitation.  Pt was able to take care of her kids without difficulty.   ?  ?PATIENT GOALS:  return to PLOF ?  ?  ?OBJECTIVE:  ?  ?TODAY'S TREATMENT:     ?  ? GAIT:        ?Pt has  improved quality of gait with improved Wb'ing thru L LE though does cont to favor L LE with minimally increased stance time on R LE.  She also has improved gait with increased gait speed though pt does have a minimally increased limp.  Pt reports 2-3/10 medial ankle pain with gait.  ? ? ?Therapeutic Exercise:  ?           -Reviewed current function, HEP compliance, response to prior Rx, and pain levels.   ?  -Pt performed:   ?Bike x 5 mins at L2 ?Standing heel raises 2x10 reps ?DF with GTB x 10 reps ?Eve and Inv with YTB 2x10 reps ?Retro step up on 4 inch step  2x10 reps ?Mini squats 2x10 reps with bilat UE assist ?-Educated pt concerning HEP and updated HEP. ? ?  ?  ?  ?-Neuro Re-ed Activities: ?-SLS 3x16,17, and 30 sec without UE assist ?-Gait Sim  without UE assist 2x10 reps ?-Cone taps 3x5 reps with cone on table ?-step up and hold on airex 2x10 fwd and lat ? ? ?  ?PATIENT EDUCATION:  ?Education details:  Reviewed HEP and updated HEP.  Gave pt a HEP handout and educated pt in correct form and appropriate frequency. PT answered questions. POC and objective findings.  Updated HEP and gave pt a HEP handout.  Educated pt in correct form and appropriate frequency.  ?Person educated: Patient ?Education method: Explanation, Demonstration, Tactile cues, Verbal cues, handout ?Education comprehension: verbalized understanding, returned demonstration, verbal cues required, and tactile cues required ?  ?  ?HOME EXERCISE PROGRAM: ?Access Code: [email or web address] ?URL: [email or web address] ?Date: 02/26/2021 ?Prepared by: Ronny Flurry ? ?Exercises ?ANKLE PUMPS - 2-3 x daily - 7 x weekly - 2 sets - 10 reps ?Seated Toe Curl - 3 x daily - 7 x weekly - 2-3 sets - 10 reps ?Long Sitting Calf Stretch with Strap - 2 x daily - 7 x weekly - 2-3 reps - 20 seconds hold ?Supine Heel Slide - 2 x daily - 7 x weekly - 2 sets - 10 reps ?Supine Active Straight Leg Raise - 1 x daily - 6-7 x weekly - 2 sets - 10 reps ?Seated Ankle Alphabet - 2-3 x daily - 7 x weekly - 1 reps ?Seated Ankle Circles - 2 x daily - 7 x weekly - 2 sets - 10 reps ?Sidelying Hip Abduction - 1 x daily - 5-7 x weekly - 2 sets - 10 reps ?Ankle Inversion Eversion Towel Slide - 2 x daily - 7 x weekly - 2 sets - 10 reps ?Seated Toe Raise - 2 x daily - 7 x weekly - 2 sets - 10 reps ?Seated Heel Raise - 2 x daily - 7 x weekly - 2 sets - 10 reps ?Gastroc Stretch on Wall - 2 x daily - 7 x weekly - 2 reps - 20-30 seconds hold ?Long Sitting Ankle Dorsiflexion with Anchored Resistance - 1 x daily - 3-4 x weekly - 2-3 sets - 10 reps ?Ankle and Toe Plantarflexion with Resistance - 1 x daily - 3-4 x weekly - 2-3 sets - 10 reps ?Single Leg Stance - 1 x daily - 7 x weekly - 3 reps - 20 second hold ?Gait Sim  once per day, 2 sets  of 10 reps ?Seated Ankle Inversion with Anchored Resistance - 1 x daily - 4 x weekly - 2-3 sets - 10 reps ?Seated Ankle Eversion with Anchored Resistance - 1 x daily - 4 x weekly - 2-3  sets - 10 reps ? ? ? ? ? ?  ?ASSESSMENT: ?  ?CLINICAL IMPRESSION: ?Pt was re-evaluated by PT last visit though was unable to return to PT for approx 2 weeks due to the clinic's schedule.  Pt continues to make slow but steady progress with gait.  She continues to report medial L ankle pain.  Pt demonstrates improved control with proprioceptive activities though still has deficits.  She demonstrates improved tolerance by being able to perform exercises with increased Wb'ing.  Pt does have calf weakness as evidenced by being limited in height with L heel raise. PT updated HEP today.  Pt should benefit from continued skilled PT to address impairments and goals and to assist with restoring PLOF.  ? ? ?  Objective impairments include Abnormal gait, decreased activity tolerance, decreased endurance, decreased mobility, difficulty walking, decreased ROM, decreased strength, hypomobility, impaired flexibility, and pain. These impairments are limiting patient from cleaning, community activity, meal prep, occupation, laundry, yard work, and shopping.  ?  ? ?  ?  ?GOALS: ?  ?  ?SHORT TERM GOALS: ?  ?STG Name Target Date Goal status  ?1 Pt will be independent and compliant with HEP for improved pain, ROM, strength, and function.  ?Baseline:  01/18/2021 GOAL MET  ?2 Pt will demo at least 8-10 deg increase in AROM of DF, PF, and Inv for improved stiffness and mobility.   ?Baseline:  01/25/2021 66% MET  ?3 Pt will progress with [place] as MD allows without adverse effects for improved gait and mobility ?Baseline: 01/25/2021 GOAL MET  ?4 Pt will perform a 6 inch step up with good control and form.  ?Baseline: 02/22/2021 GOAL MET  ?5 Pt will be able to perform her daily transfers without difficulty. ?Baseline: 02/22/2021 GOAL MET  ?         ?         ?   ?LONG TERM GOALS:  ?  ?LTG Name Target Date Goal status  ?1 Pt will ambulate with a normalized heel to toe gait pattern without limping.  ?Baseline: 06/10/2021 ? PARTIALLY MET  ?2 Pt will ambulate community

## 2021-04-29 ENCOUNTER — Encounter (HOSPITAL_BASED_OUTPATIENT_CLINIC_OR_DEPARTMENT_OTHER): Payer: Self-pay | Admitting: Physical Therapy

## 2021-05-06 ENCOUNTER — Other Ambulatory Visit: Payer: Self-pay

## 2021-05-06 ENCOUNTER — Encounter (HOSPITAL_BASED_OUTPATIENT_CLINIC_OR_DEPARTMENT_OTHER): Payer: Self-pay | Admitting: Physical Therapy

## 2021-05-06 ENCOUNTER — Ambulatory Visit (HOSPITAL_BASED_OUTPATIENT_CLINIC_OR_DEPARTMENT_OTHER): Payer: 59 | Admitting: Physical Therapy

## 2021-05-06 DIAGNOSIS — M25572 Pain in left ankle and joints of left foot: Secondary | ICD-10-CM | POA: Diagnosis not present

## 2021-05-06 DIAGNOSIS — M6281 Muscle weakness (generalized): Secondary | ICD-10-CM

## 2021-05-06 DIAGNOSIS — R262 Difficulty in walking, not elsewhere classified: Secondary | ICD-10-CM

## 2021-05-06 DIAGNOSIS — M25672 Stiffness of left ankle, not elsewhere classified: Secondary | ICD-10-CM

## 2021-05-06 NOTE — Therapy (Signed)
?OUTPATIENT PHYSICAL THERAPY TREATMENT NOTE  ? ?Patient Name: Janet Dalton ?MRN: [id number] ?DOB:1975/03/03, 46 y.o., female ?Today's Date: 05/06/2021 ? ?PCP: Orpah Melter, MD ? PT End of Session - 05/06/21 4081   ? ? Visit Number 20   ? Number of Visits 24   ? Date for PT Re-Evaluation 05/21/21   ? Authorization Type UHC   ? PT Start Time [phone]   ? PT Stop Time 1024   ? PT Time Calculation (min) 45 min   ? Activity Tolerance Patient tolerated treatment well   ? Behavior During Therapy [facility] for tasks assessed/performed   ? ?  ?  ? ?  ? ? ? ? ? ? ? ? ? ? ? ? ? ?History reviewed. No pertinent past medical history. ?Past Surgical History:  ?Procedure Laterality Date  ? EYE SURGERY    ? KNEE SURGERY Left   ? TIBIA IM NAIL INSERTION Left 11/30/2020  ? Procedure: INTRAMEDULLARY (IM) NAIL TIBIAL AND TIBIAL PLATE;  Surgeon: Vanetta Mulders, MD;  Location: [place];  Service: Orthopedics;  Laterality: Left;  ? ?Patient Active Problem List  ? Diagnosis Date Noted  ? Left tibial fracture 11/29/2020  ? ? ?REFERRING PROVIDER: Vanetta Mulders, MD ?  ?REFERRING DIAG: S82.52XD (ICD-10-CM) - Closed displaced fracture of medial malleolus of left tibia with routine healing, subsequent encounter  ?  ?THERAPY DIAG:  ?Pain in left ankle and joints of left foot ?  ?Stiffness of left ankle, not elsewhere classified ?  ?Muscle weakness (generalized) ?  ?Difficulty in walking, not elsewhere classified ?  ?ONSET DATE:  Injury 11/29/2020 / Surgery 11/30/2020 ?  ?SUBJECTIVE:  ?  ?SUBJECTIVE STATEMENT: ?-Pt is s/p left intramedullary nail of the tibia on 11/30/2020. ? ?Pt states she continues to have medial ankle pain.  Pt states her walking has improved though her medial ankle pain has not changed.  She continues to c/o of medial ankle pain with ambulation and standing for awhile.  Pt has medial ankle pain with grocery shopping.  ?Pt states she feels fine ascending stairs, though has difficulty with descending stairs.  Pt states she has the most  difficulty with descending stairs.   ?  ?RESPONSE TO PRIOR RX:  Pt denies any adverse effects after prior Rx.  Pt states the numbness felt better after Rx.  ?HEP COMPLIANCE:  Pt states she has been doing her HEP though not as much as she should.  ?FUNCTIONAL IMPROVEMENTS:  ambulation, standing, stairs, make and carry her own coffee, driving, ROM, carrying groceries.  Pt states she is able to perform most of her household chores without much difficulty.  Pt reports no difficulty with transfers.  ?FUNCTIONAL LIMITATIONS: ambulation, gait speed, descending stairs.  Walking up/down her sloped driveway  ? ? ?PERTINENT HISTORY: ?MD notes/orders:   WBAT.  Prior L knee surgeries x 2 (approx 7 years ago) ?  ?PAIN:  ?Are you having pain? Yes ; Pt states he back is still bothering her.  ?NRPS scale: 1-2/10 currently, 0/10 best, 3/10 worst.    ?Pain location:  L medial ankle and distal LE  ?Pain orientation: Left  ?  ?  ?PRECAUTIONS: Other: per surgery and WB'ing orders per MD.  See below ? ?  ?WEIGHT BEARING RESTRICTIONS Yes; weightbearing as tolerated on the left leg.   ? ?PLOF: Independent;  Pt ambulated without an AD.  Pt was able to ambulate community distance and perform all of her normal functional mobility skills independently without limitation.  Pt was  able to take care of her kids without difficulty.   ?  ?PATIENT GOALS:  return to PLOF ?  ?  ?OBJECTIVE:  ?  ?TODAY'S TREATMENT:     ?  ?Therapeutic Exercise:  ?           -Reviewed current function, HEP compliance, response to prior Rx, and pain levels.   ?  -Pt performed:   ?Bike x 5 mins at L2 ?Standing heel raises 3x12 reps ?Lateral band walks with RTB 1 set ?Eve and Inv with YTB 2x10 reps ?  ?-Kinetic Activities: ? -Step ups on 8 inch step 2x10 reps without UE assist ?-Retro step up on 4 inch step  2x10 reps ?-Mini squats 2x10 reps without bilat UE assist  ?  ?-Neuro Re-ed Activities: ?-Gait Sim on airex without UE assist 2x10 reps ?-Cone taps 2x10 reps each with  cone on table and on stool ?-step up and hold on airex x20 fwd and lat ? ? ?  ?PATIENT EDUCATION:  ?Education details:  HEP, POC and exercise form.  PT answered questions.   ?Person educated: Patient ?Education method: Explanation, Demonstration, Tactile cues, Verbal cues ?Education comprehension: verbalized understanding, returned demonstration, verbal cues required, and tactile cues required ?  ?  ?HOME EXERCISE PROGRAM: ?Access Code: [email or web address] ?URL: [email or web address] ?Date: 02/26/2021 ?Prepared by: Ronny Flurry ? ?Exercises ?ANKLE PUMPS - 2-3 x daily - 7 x weekly - 2 sets - 10 reps ?Seated Toe Curl - 3 x daily - 7 x weekly - 2-3 sets - 10 reps ?Long Sitting Calf Stretch with Strap - 2 x daily - 7 x weekly - 2-3 reps - 20 seconds hold ?Supine Heel Slide - 2 x daily - 7 x weekly - 2 sets - 10 reps ?Supine Active Straight Leg Raise - 1 x daily - 6-7 x weekly - 2 sets - 10 reps ?Seated Ankle Alphabet - 2-3 x daily - 7 x weekly - 1 reps ?Seated Ankle Circles - 2 x daily - 7 x weekly - 2 sets - 10 reps ?Sidelying Hip Abduction - 1 x daily - 5-7 x weekly - 2 sets - 10 reps ?Ankle Inversion Eversion Towel Slide - 2 x daily - 7 x weekly - 2 sets - 10 reps ?Seated Toe Raise - 2 x daily - 7 x weekly - 2 sets - 10 reps ?Seated Heel Raise - 2 x daily - 7 x weekly - 2 sets - 10 reps ?Gastroc Stretch on Wall - 2 x daily - 7 x weekly - 2 reps - 20-30 seconds hold ?Long Sitting Ankle Dorsiflexion with Anchored Resistance - 1 x daily - 3-4 x weekly - 2-3 sets - 10 reps ?Ankle and Toe Plantarflexion with Resistance - 1 x daily - 3-4 x weekly - 2-3 sets - 10 reps ?Single Leg Stance - 1 x daily - 7 x weekly - 3 reps - 20 second hold ?Gait Sim  once per day, 2 sets of 10 reps ?Seated Ankle Inversion with Anchored Resistance - 1 x daily - 4 x weekly - 2-3 sets - 10 reps ?Seated Ankle Eversion with Anchored Resistance - 1 x daily - 4 x weekly - 2-3 sets - 10 reps ? ? ? ?  ?ASSESSMENT: ?  ?CLINICAL IMPRESSION: ?Pt  reports improved ambulation and demonstrates improved gait though cont to report medial ankle pain.  Pt has made slow progress with gait.  She reports no improvement with medial ankle pain which increases with increased ambulation or  standing.  Pt has improved with tolerance to and performance of activity though reports continued difficulty with descending stairs.  Pt performed exercises well and demonstrates improved control and form with proprioceptive activities.  She continues to have weakness and ROM limitations with inversion.  Pt responded well to Rx reporting no increased pain after Rx.  Pt should benefit from continued skilled PT to address impairments and goals and to assist with restoring PLOF.  ? ? ?  Objective impairments include Abnormal gait, decreased activity tolerance, decreased endurance, decreased mobility, difficulty walking, decreased ROM, decreased strength, hypomobility, impaired flexibility, and pain. These impairments are limiting patient from cleaning, community activity, meal prep, occupation, laundry, yard work, and shopping.  ?  ? ?  ?  ?GOALS: ?  ?  ?SHORT TERM GOALS: ?  ?STG Name Target Date Goal status  ?1 Pt will be independent and compliant with HEP for improved pain, ROM, strength, and function.  ?Baseline:  01/18/2021 GOAL MET  ?2 Pt will demo at least 8-10 deg increase in AROM of DF, PF, and Inv for improved stiffness and mobility.   ?Baseline:  01/25/2021 66% MET  ?3 Pt will progress with [place] as MD allows without adverse effects for improved gait and mobility ?Baseline: 01/25/2021 GOAL MET  ?4 Pt will perform a 6 inch step up with good control and form.  ?Baseline: 02/22/2021 GOAL MET  ?5 Pt will be able to perform her daily transfers without difficulty. ?Baseline: 02/22/2021 GOAL MET  ?         ?         ?  ?LONG TERM GOALS:  ?  ?LTG Name Target Date Goal status  ?1 Pt will ambulate with a normalized heel to toe gait pattern without limping.  ?Baseline: 06/17/2021 ? PARTIALLY MET   ?2 Pt will ambulate community distance without an AD without significant pain or difficulty.   ?Baseline: 05/21/2021 PROGRESSING  ?3 Pt will perform stairs with a reciprocal gait without rail with good control

## 2021-05-13 ENCOUNTER — Ambulatory Visit (HOSPITAL_BASED_OUTPATIENT_CLINIC_OR_DEPARTMENT_OTHER): Payer: 59 | Admitting: Physical Therapy

## 2021-05-13 ENCOUNTER — Encounter (HOSPITAL_BASED_OUTPATIENT_CLINIC_OR_DEPARTMENT_OTHER): Payer: Self-pay | Admitting: Physical Therapy

## 2021-05-13 DIAGNOSIS — M25572 Pain in left ankle and joints of left foot: Secondary | ICD-10-CM | POA: Diagnosis not present

## 2021-05-13 DIAGNOSIS — M6281 Muscle weakness (generalized): Secondary | ICD-10-CM

## 2021-05-13 DIAGNOSIS — M25672 Stiffness of left ankle, not elsewhere classified: Secondary | ICD-10-CM

## 2021-05-13 DIAGNOSIS — R262 Difficulty in walking, not elsewhere classified: Secondary | ICD-10-CM

## 2021-05-13 NOTE — Therapy (Addendum)
OUTPATIENT PHYSICAL THERAPY TREATMENT NOTE   Patient Name: Janet Dalton MRN: [id number] DOB:1975-03-30, 46 y.o., female Today's Date: 05/13/2021  PCP: Orpah Melter, MD  PT End of Session - 05/13/21 1016     Visit Number 21    Number of Visits 24    Date for PT Re-Evaluation 05/21/21    Authorization Type [employer]    PT Start Time 0936    PT Stop Time 1016    PT Time Calculation (min) 40 min    Activity Tolerance Patient tolerated treatment well    Behavior During Therapy [facility] for tasks assessed/performed               History reviewed. No pertinent past medical history. Past Surgical History:  Procedure Laterality Date   EYE SURGERY     KNEE SURGERY Left    TIBIA IM NAIL INSERTION Left 11/30/2020   Procedure: INTRAMEDULLARY (IM) NAIL TIBIAL AND TIBIAL PLATE;  Surgeon: Vanetta Mulders, MD;  Location: [facility];  Service: Orthopedics;  Laterality: Left;   Patient Active Problem List   Diagnosis Date Noted   Left tibial fracture 11/29/2020    REFERRING PROVIDER: Vanetta Mulders, MD   REFERRING DIAG: S82.52XD (ICD-10-CM) - Closed displaced fracture of medial malleolus of left tibia with routine healing, subsequent encounter    THERAPY DIAG:  Pain in left ankle and joints of left foot   Stiffness of left ankle, not elsewhere classified   Muscle weakness (generalized)   Difficulty in walking, not elsewhere classified   ONSET DATE:  Injury 11/29/2020 / Surgery 11/30/2020   SUBJECTIVE:    SUBJECTIVE STATEMENT: -Pt is over 5 months s/p left intramedullary nail of the tibia on 11/30/2020.  Pt states she continues to have medial ankle pain.  Pt reports 2/10 pain with ankle AROM currently though can be 3-7/10 with certain movements.  Pt reports 2/10 pain with ambulation and 1/10 pain with sitting currently.  Pt reports her pain is about the same.  She has increased pain if she ambulates increased distance.  Pt reports improved pain with standing.  Pt has 2/10 medial ankle  pain with grocery shopping.  Pt states she feels fine ascending stairs, though has difficulty with descending stairs.  Pt states she has the most difficulty with descending stairs.   Pt denies any adverse effects after prior Rx.  Pt still has numbness though a little better.  Pt reports she is doing her exercises most of the time.     PERTINENT HISTORY: MD notes/orders:   WBAT.  Prior L knee surgeries x 2 (approx 7 years ago)   PAIN:  Are you having pain? Yes ; Pt states he back is still bothering her.  NRPS scale: 1-2/10 currently, 0/10 best, 3/10 worst.    Pain location:  L medial ankle and distal LE  Pain orientation: Left      PRECAUTIONS: Other: per surgery and WB'ing orders per MD.  See below    WEIGHT BEARING RESTRICTIONS Yes; weightbearing as tolerated on the left leg.    PLOF: Independent;  Pt ambulated without an AD.  Pt was able to ambulate community distance and perform all of her normal functional mobility skills independently without limitation.  Pt was able to take care of her kids without difficulty.     PATIENT GOALS:  return to PLOF     OBJECTIVE:    TODAY'S TREATMENT:       Therapeutic Exercise:             -  Reviewed current function, HEP compliance, response to prior Rx, and pain levels.     -Pt performed:   Bike x 5 mins at L2.5 Shuttle leg press 3 - 25's 2x10 Standing heel raises x12 reps and 2x12 on airex  Lateral band walks with RTB 2x10    -Kinetic Activities:  -Retro step up on 4 inch step x10 reps and 6 inch step 2x10 reps -Mini squats x10 reps without bilat UE assist  and 10 reps with 5# KB -Pt ascended stairs and descended stairs without rail x 1 rep 1 flight   -Neuro Re-ed Activities: -Gait Sim on airex without UE assist 2x10 reps -[place] taps x10 reps on stool and 2x10 reps on 8 inch step -step up and hold on airex x20 fwd and lat     PATIENT EDUCATION:  Education details:  HEP, POC and exercise form.  PT answered questions.   Person  educated: Patient Education method: Explanation, Demonstration, Tactile cues, Verbal cues Education comprehension: verbalized understanding, returned demonstration, verbal cues required, and tactile cues required     HOME EXERCISE PROGRAM: Access Code: [email or web address] URL: [email or web address] Date: 02/26/2021 Prepared by: Ronny Flurry  Exercises ANKLE PUMPS - 2-3 x daily - 7 x weekly - 2 sets - 10 reps Seated Toe Curl - 3 x daily - 7 x weekly - 2-3 sets - 10 reps Long Sitting Calf Stretch with Strap - 2 x daily - 7 x weekly - 2-3 reps - 20 seconds hold Supine Heel Slide - 2 x daily - 7 x weekly - 2 sets - 10 reps Supine Active Straight Leg Raise - 1 x daily - 6-7 x weekly - 2 sets - 10 reps Seated Ankle Alphabet - 2-3 x daily - 7 x weekly - 1 reps Seated Ankle Circles - 2 x daily - 7 x weekly - 2 sets - 10 reps Sidelying Hip Abduction - 1 x daily - 5-7 x weekly - 2 sets - 10 reps Ankle Inversion Eversion Towel Slide - 2 x daily - 7 x weekly - 2 sets - 10 reps Seated Toe Raise - 2 x daily - 7 x weekly - 2 sets - 10 reps Seated Heel Raise - 2 x daily - 7 x weekly - 2 sets - 10 reps Gastroc Stretch on Wall - 2 x daily - 7 x weekly - 2 reps - 20-30 seconds hold Long Sitting Ankle Dorsiflexion with Anchored Resistance - 1 x daily - 3-4 x weekly - 2-3 sets - 10 reps Ankle and Toe Plantarflexion with Resistance - 1 x daily - 3-4 x weekly - 2-3 sets - 10 reps Single Leg Stance - 1 x daily - 7 x weekly - 3 reps - 20 second hold Gait Sim  once per day, 2 sets of 10 reps Seated Ankle Inversion with Anchored Resistance - 1 x daily - 4 x weekly - 2-3 sets - 10 reps Seated Ankle Eversion with Anchored Resistance - 1 x daily - 4 x weekly - 2-3 sets - 10 reps      ASSESSMENT:   CLINICAL IMPRESSION: Pt continues to report medial ankle pain and reports increased ankle pain with ambulating longer distance.  Pt reports no change in medial ankle pain.  Pt still has numbness though states  numbness has improved.  PT progressed exercises and pt is improving with strength and stability as evidenced by performance of exercises.  Pt able to perform retro step up on a 6 inch step today. Pt  demonstrates improved form and control with proprioceptive activities.  She demonstrates improved form with squats and required cuing for correct knee alignment with shuttle leg press and squats.  Pt performed stairs without rail and had a reciprocal gait ascending and a step thru gait descending.  Pt able to ascend stairs well though does have some difficulty with descending stairs.  Pt responded well to Rx reporting no increased pain after Rx.  Pt should benefit from continued skilled PT to address impairments and goals and to assist with restoring PLOF.         Objective impairments include Abnormal gait, decreased activity tolerance, decreased endurance, decreased mobility, difficulty walking, decreased ROM, decreased strength, hypomobility, impaired flexibility, and pain. These impairments are limiting patient from cleaning, community activity, meal prep, occupation, laundry, yard work, and shopping.         GOALS:     SHORT TERM GOALS:   STG Name Target Date Goal status  1 Pt will be independent and compliant with HEP for improved pain, ROM, strength, and function.  Baseline:  01/18/2021 GOAL MET  2 Pt will demo at least 8-10 deg increase in AROM of DF, PF, and Inv for improved stiffness and mobility.   Baseline:  01/25/2021 66% MET  3 Pt will progress with [place] as MD allows without adverse effects for improved gait and mobility Baseline: 01/25/2021 GOAL MET  4 Pt will perform a 6 inch step up with good control and form.  Baseline: 02/22/2021 GOAL MET  5 Pt will be able to perform her daily transfers without difficulty. Baseline: 02/22/2021 GOAL MET                      LONG TERM GOALS:    LTG Name Target Date Goal status  1 Pt will ambulate with a normalized heel to toe gait pattern  without limping.  Baseline: 06/24/2021  PARTIALLY MET  2 Pt will ambulate community distance without an AD without significant pain or difficulty.   Baseline: 05/21/2021 PROGRESSING  3 Pt will perform stairs with a reciprocal gait without rail with good control.  Baseline: 05/21/2021 PARTIALLY MET  4 Pt will demo at least 10 deg of L ankle DF AROM for improved stiffness and gait.  Baseline: 03/08/2021 GOAL MET  5 Pt will demo 5/5 L ankle strength in DF, Eve, and Inv and WFL for PF (tested in [place]) for improved tolerance to daily mobility and taking care of kids and to assist in restoring PLOF.  Baseline: 04/13/2021 75%  6 Pt will be able to perform her normal standing activities including household chores without significant pain or difficulty.  Baseline: 05/21/2021 PARTIALLY MET             PLAN:  PLANNED INTERVENTIONS: Therapeutic exercises, Therapeutic activity, Neuro Muscular re-education, Balance training, Gait training, Patient/Family education, Joint mobilization, Stair training, Aquatic Therapy, Electrical stimulation, Cryotherapy, Taping, and Manual therapy   PLAN FOR NEXT SESSION: Pt is going on vacation and doesn't plan to schedule any further PT until after she sees MD.  Pt sees MD on 05/31/2021. Cont with ankle strengthening, proprioceptive training, Gait training, and progressing closed chain activities.      Selinda Michaels III PT, DPT 05/13/21 7:57 PM   PHYSICAL THERAPY DISCHARGE SUMMARY  Visits from Start of Care: 21  Current functional level related to goals / functional outcomes: Unable to assess due to pt not being present at discharge.     Remaining deficits: Unable  to assess due to pt not being present at discharge.       Education / Equipment: Pt has a HEP   Pt received 21 visits from 12/28/2020 to 05/13/2021.  During her last visit, Pt stated she wasn't going to schedule any further PT until after she sees MD in April after her vacation.  Pt will be considered  discharged at this time and will cont with HEP.   Selinda Michaels III PT, DPT 11/25/21 10:48 AM

## 2021-05-31 ENCOUNTER — Other Ambulatory Visit (HOSPITAL_BASED_OUTPATIENT_CLINIC_OR_DEPARTMENT_OTHER): Payer: Self-pay | Admitting: Orthopaedic Surgery

## 2021-05-31 ENCOUNTER — Ambulatory Visit (HOSPITAL_BASED_OUTPATIENT_CLINIC_OR_DEPARTMENT_OTHER)
Admission: RE | Admit: 2021-05-31 | Discharge: 2021-05-31 | Disposition: A | Discharge status: Home / Self Care | Payer: 59 | Source: Ambulatory Visit | Attending: Orthopaedic Surgery | Admitting: Orthopaedic Surgery

## 2021-05-31 ENCOUNTER — Ambulatory Visit (INDEPENDENT_AMBULATORY_CARE_PROVIDER_SITE_OTHER): Payer: 59 | Admitting: Orthopaedic Surgery

## 2021-05-31 DIAGNOSIS — S8252XD Displaced fracture of medial malleolus of left tibia, subsequent encounter for closed fracture with routine healing: Secondary | ICD-10-CM | POA: Insufficient documentation

## 2021-05-31 NOTE — Progress Notes (Signed)
? ?                            ? ? ?Post Operative Evaluation ?  ? ?Procedure/Date of Surgery: 11/30/20 -left tibial nail for tib-fib fracture ? ?Interval History:  ? ?05/31/2021: Presents today for follow-up of the above procedure.  She continues to make improvement.  She does still walk with a very mild limp.  Overall strength is improving.  Her numbness over the top of the foot is also improving.  She does still continuously have some tenderness over the posterior medial malleolus ? ? ?PMH/PSH/Family History/Social History/Meds/Allergies:   ?No past medical history on file. ?Past Surgical History:  ?Procedure Laterality Date  ? EYE SURGERY    ? KNEE SURGERY Left   ? TIBIA IM NAIL INSERTION Left 11/30/2020  ? Procedure: INTRAMEDULLARY (IM) NAIL TIBIAL AND TIBIAL PLATE;  Surgeon: Huel Cote, MD;  Location: [facility];  Service: Orthopedics;  Laterality: Left;  ? ?Social History  ? ?Socioeconomic History  ? Marital status: Married  ?  Spouse name: Not on file  ? Number of children: Not on file  ? Years of education: Not on file  ? Highest education level: Not on file  ?Occupational History  ? Not on file  ?Tobacco Use  ? Smoking status: Never  ? Smokeless tobacco: Never  ?Substance and Sexual Activity  ? Alcohol use: Never  ? Drug use: Never  ? Sexual activity: Not on file  ?Other Topics Concern  ? Not on file  ?Social History Narrative  ? Not on file  ? ?Social Determinants of Health  ? ?Financial Resource Strain: Not on file  ?Food Insecurity: Not on file  ?Transportation Needs: Not on file  ?Physical Activity: Not on file  ?Stress: Not on file  ?Social Connections: Not on file  ? ?No family history on file. ?No Known Allergies ?Current Outpatient Medications  ?Medication Sig Dispense Refill  ? aspirin EC 325 MG tablet Take 1 tablet (325 mg total) by mouth daily. (Patient not taking: Reported on 03/05/2021) 30 tablet 0  ? oxycodone (OXY-IR) 5 MG capsule Take 1 capsule (5 mg total) by mouth every 4 (four) hours as  needed (severe pain). (Patient not taking: Reported on 03/05/2021) 20 capsule 0  ? ?No current facility-administered medications for this visit.  ? ?No results found. ? ?Review of Systems:   ?A ROS was performed including pertinent positives and negatives as documented in the HPI. ? ? ?Musculoskeletal Exam:   ? ? ?Incisions are well-healed.  Left knee range of motion is from 0 to 140 degrees.  Left ankle dorsiflexion is to 25 degrees plantarflexion is 30 degrees.  Tenderness about the PT tendon.  She is not able to do a single leg standing toe raise on the left small patch of numbness about the dorsum of the left foot ? ? ?Imaging:   ? ?X-rays 4 views left tibia ?Status post intramedullary nail left tibia without evidence of hardware complication.  There is interval callus formation ? ?I personally reviewed and interpreted the radiographs. ? ? ?Assessment:   ?46 year old female status post left tibial nail.  She continues to have increased callus formation about the tibia as well as fibula.  At today's visit she does have some significant PT weakness.  She is also having some tendinitis with this as well.  To that effect I recommended physical therapy twice monthly to correct the strength deficits.  I also recommended an arch support as well to help offload her PT tendon.  She will obtain this from offline ? ?Plan :   ? ?-Return to clinic in 3 months ? ? ?I personally saw and evaluated the patient, and participated in the management and treatment plan. ? ?Huel Cote, MD ?Attending Physician, Orthopedic Surgery ? ?This document was dictated using Conservation officer, historic buildings. A reasonable attempt at proof reading has been made to minimize errors. ? ?

## 2021-08-30 ENCOUNTER — Ambulatory Visit (HOSPITAL_BASED_OUTPATIENT_CLINIC_OR_DEPARTMENT_OTHER): Payer: 59 | Admitting: Orthopaedic Surgery

## 2021-09-01 ENCOUNTER — Ambulatory Visit (INDEPENDENT_AMBULATORY_CARE_PROVIDER_SITE_OTHER): Payer: 59

## 2021-09-01 ENCOUNTER — Ambulatory Visit (INDEPENDENT_AMBULATORY_CARE_PROVIDER_SITE_OTHER): Payer: 59 | Admitting: Orthopaedic Surgery

## 2021-09-01 DIAGNOSIS — S8252XD Displaced fracture of medial malleolus of left tibia, subsequent encounter for closed fracture with routine healing: Secondary | ICD-10-CM

## 2021-09-01 NOTE — Progress Notes (Signed)
Post Operative Evaluation    Procedure/Date of Surgery: 11/30/20 -left tibial nail for tib-fib fracture  Interval History:   09/01/2021: Presents today for follow-up of the above procedure.  She has continued to progress.  At this time she is walking without a limp.  She has no pain except for when she is running she will experience soreness.  Numbness is improved.  Overall doing better.  PMH/PSH/Family History/Social History/Meds/Allergies:   No past medical history on file. Past Surgical History:  Procedure Laterality Date   EYE SURGERY     KNEE SURGERY Left    TIBIA IM NAIL INSERTION Left 11/30/2020   Procedure: INTRAMEDULLARY (IM) NAIL TIBIAL AND TIBIAL PLATE;  Surgeon: Huel Cote, MD;  Location: [facility];  Service: Orthopedics;  Laterality: Left;   Social History   Socioeconomic History   Marital status: Married    Spouse name: Not on file   Number of children: Not on file   Years of education: Not on file   Highest education level: Not on file  Occupational History   Not on file  Tobacco Use   Smoking status: Never   Smokeless tobacco: Never  Substance and Sexual Activity   Alcohol use: Never   Drug use: Never   Sexual activity: Not on file  Other Topics Concern   Not on file  Social History Narrative   Not on file   Social Determinants of Health   Financial Resource Strain: Not on file  Food Insecurity: Not on file  Transportation Needs: Not on file  Physical Activity: Not on file  Stress: Not on file  Social Connections: Not on file   No family history on file. No Known Allergies Current Outpatient Medications  Medication Sig Dispense Refill   aspirin EC 325 MG tablet Take 1 tablet (325 mg total) by mouth daily. (Patient not taking: Reported on 03/05/2021) 30 tablet 0   oxycodone (OXY-IR) 5 MG capsule Take 1 capsule (5 mg total) by mouth every 4 (four) hours as needed (severe pain). (Patient not taking: Reported on  03/05/2021) 20 capsule 0   No current facility-administered medications for this visit.   No results found.  Review of Systems:   A ROS was performed including pertinent positives and negatives as documented in the HPI.   Musculoskeletal Exam:     Incisions are well-healed.  Left knee range of motion is from 0 to 140 degrees.  Left ankle dorsiflexion is to 25 degrees plantarflexion is 30 degrees.  Tenderness about the PT tendon.  She is not able to do a single leg standing toe raise on the left small patch of numbness about the dorsum of the left foot   Imaging:    X-rays 4 views left tibia Status post intramedullary nail left tibia without evidence of hardware complication.  There is interval callus formation  I personally reviewed and interpreted the radiographs.   Assessment:   46 year old female status post left tibial nail.  Overall she is continuing to progress slowly although steadily.  Flat affect I do believe that she can be activity as tolerated I would like her to increase her endurance with jogging and running.  I will plan to see her back as needed Plan :    -Return to clinic as needed   I personally saw and  evaluated the patient, and participated in the management and treatment plan.  Vanetta Mulders, MD Attending Physician, Orthopedic Surgery  This document was dictated using Dragon voice recognition software. A reasonable attempt at proof reading has been made to minimize errors.

## 2021-10-15 ENCOUNTER — Telehealth (HOSPITAL_BASED_OUTPATIENT_CLINIC_OR_DEPARTMENT_OTHER): Payer: Self-pay | Admitting: Orthopaedic Surgery

## 2021-10-15 NOTE — Telephone Encounter (Signed)
IC patient. I emailed authorization form to her. She will complete and email back. I will forward records upon receipt of auth.

## 2021-10-15 NOTE — Telephone Encounter (Signed)
Records request.

## 2021-10-19 ENCOUNTER — Telehealth: Payer: Self-pay | Admitting: Orthopaedic Surgery

## 2021-10-19 NOTE — Telephone Encounter (Signed)
CD placed in basket to be mailed out

## 2021-10-19 NOTE — Telephone Encounter (Signed)
Received call from patient, she would like all imaging from 12/14/20 - present on CD. Please mail to home address. Thank you! (Auth on file)

## 2024-03-07 IMAGING — DX DG TIBIA/FIBULA 2V*L*
2 series · 2 of 2 positions shown · non-contrast
Comparison: 03/29/2021

CLINICAL DATA: Postop ORIF.

EXAM:
LEFT TIBIA AND FIBULA - 2 VIEW

[tibia ap]
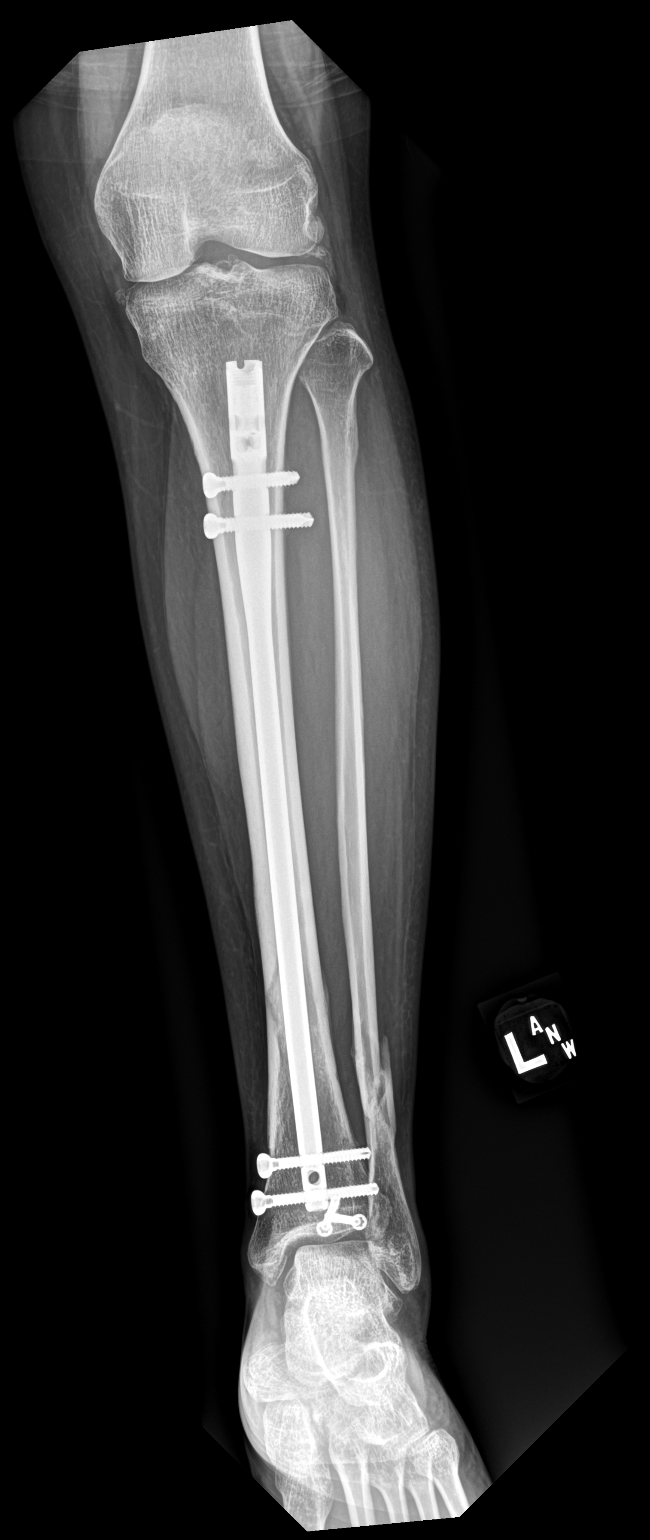

[tibia lat]
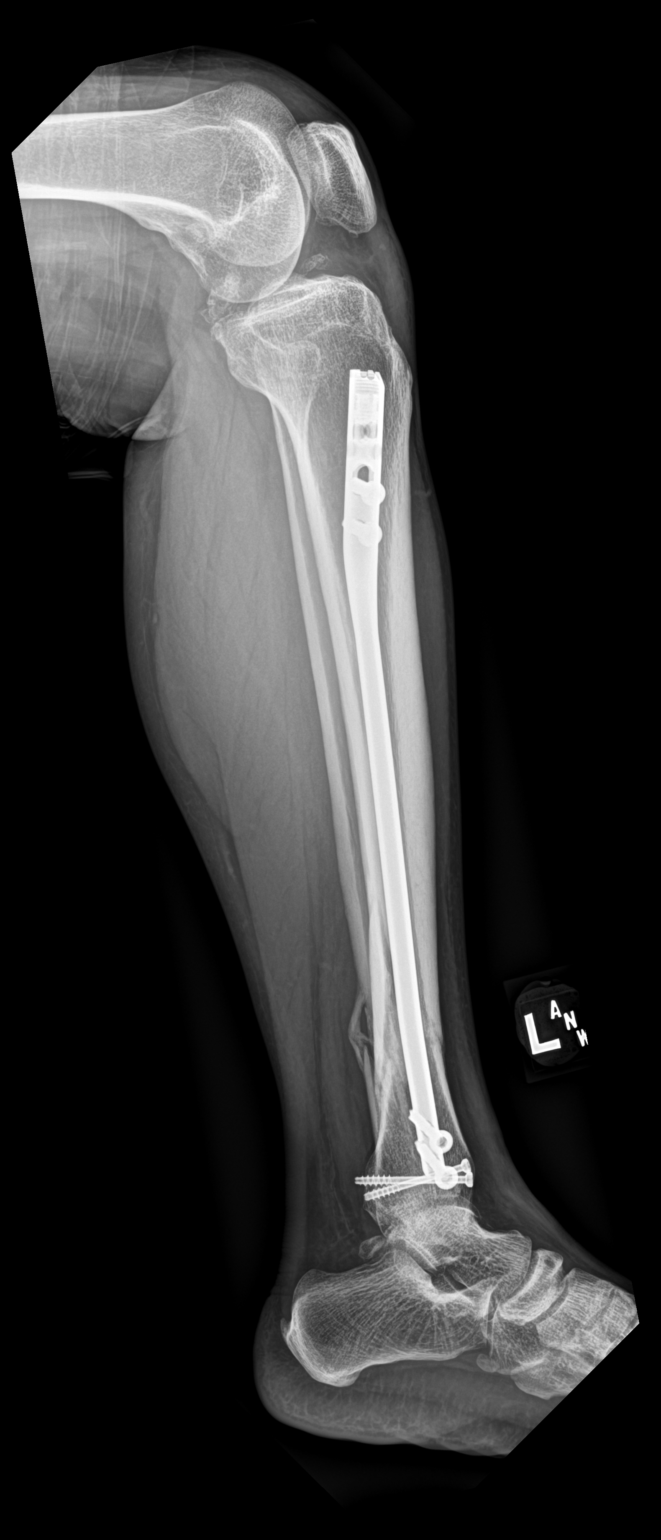

[2 of 2 positions shown; findings below may reference images not displayed]

FINDINGS: Postsurgical changes are again seen of intramedullary nail fixation
of a distal tibial diaphyseal nondisplaced fracture. There are
additional screws overlying the distal tibial plafond, unchanged. No
perihardware lucency.

Additional comminuted and mildly displaced distal fibular diaphyseal
fracture is similar to prior.

There is mild progressive fracture line healing sclerosis of both
the distal tibia and fibula. A small ossicle again is seen just
medial to the proximal medial tibial plateau. Minimal chronic
spurring at the Achilles insertion on the calcaneus.
IMPRESSION: Status post intramedullary nail fixation of distal tibial fracture
with unchanged normal alignment.

Progressive mild healing of distal tibial and fibular fractures.
